# Patient Record
Sex: Male | Born: 1990 | Race: White | Hispanic: No | Marital: Single | Smoking: Former smoker
Health system: Southern US, Community
[De-identification: ages and names within clinical notes are randomized; demographics above are authoritative.]

## PROBLEM LIST (undated history)

## (undated) DIAGNOSIS — J4530 Mild persistent asthma, uncomplicated: Secondary | ICD-10-CM

## (undated) DIAGNOSIS — Z800 Family history of malignant neoplasm of digestive organs: Secondary | ICD-10-CM

## (undated) DIAGNOSIS — Z1509 Genetic susceptibility to other malignant neoplasm: Principal | ICD-10-CM

## (undated) DIAGNOSIS — F909 Attention-deficit hyperactivity disorder, unspecified type: Secondary | ICD-10-CM

## (undated) DIAGNOSIS — K625 Hemorrhage of anus and rectum: Secondary | ICD-10-CM

## (undated) DIAGNOSIS — M549 Dorsalgia, unspecified: Secondary | ICD-10-CM

## (undated) HISTORY — DX: Hemorrhage of anus and rectum: K62.5

## (undated) HISTORY — DX: Dorsalgia, unspecified: M54.9

## (undated) HISTORY — PX: WISDOM TOOTH EXTRACTION: SHX21

## (undated) HISTORY — DX: Family history of malignant neoplasm of digestive organs: Z80.0

## (undated) HISTORY — DX: Mild persistent asthma, uncomplicated: J45.30

## (undated) HISTORY — DX: Genetic susceptibility to other malignant neoplasm: Z15.09

## (undated) HISTORY — DX: Attention-deficit hyperactivity disorder, unspecified type: F90.9

---
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
## 2011-06-29 ENCOUNTER — Ambulatory Visit (HOSPITAL_COMMUNITY): Payer: No Typology Code available for payment source | Admitting: Psychiatry

## 2011-11-30 ENCOUNTER — Ambulatory Visit: Payer: Self-pay | Admitting: Family Medicine

## 2011-12-09 ENCOUNTER — Ambulatory Visit (INDEPENDENT_AMBULATORY_CARE_PROVIDER_SITE_OTHER): Payer: No Typology Code available for payment source | Admitting: Family Medicine

## 2011-12-09 ENCOUNTER — Encounter: Payer: Self-pay | Admitting: Family Medicine

## 2011-12-09 VITALS — BP 101/68 | HR 54 | Ht 66.75 in | Wt 168.0 lb

## 2011-12-09 DIAGNOSIS — F1210 Cannabis abuse, uncomplicated: Secondary | ICD-10-CM

## 2011-12-09 DIAGNOSIS — L723 Sebaceous cyst: Secondary | ICD-10-CM

## 2011-12-09 DIAGNOSIS — L720 Epidermal cyst: Secondary | ICD-10-CM | POA: Insufficient documentation

## 2011-12-09 DIAGNOSIS — F909 Attention-deficit hyperactivity disorder, unspecified type: Secondary | ICD-10-CM

## 2011-12-09 DIAGNOSIS — F1290 Cannabis use, unspecified, uncomplicated: Secondary | ICD-10-CM

## 2011-12-09 MED ORDER — LISDEXAMFETAMINE DIMESYLATE 70 MG PO CAPS: 70.0000 mg | ORAL_CAPSULE | ORAL | Status: DC

## 2011-12-09 NOTE — Assessment & Plan Note (Signed)
Restart vyvanse.  Open 70mg  cap and take 1/2 of contents qd for at least 4-5d and then may increase to whole tab dosing qd like he was on a few months ago.  Therapeutic expectations and side effect profile of medication discussed today.  Patient's questions answered.

## 2011-12-09 NOTE — Progress Notes (Addendum)
Office Note 12/09/2011  CC:  Chief Complaint  Patient presents with  . Establish Care    ADHD meds    HPI:  Jacob Alexander is a 21 y.o. White male who is here to establish care. Patient's most recent primary MD: Dr. Milford Cage at [facility] in [place], [place].  I never saw Rashad there when I worked at [employer]. Old records were not reviewed prior to or during today's visit.  Has long hx of ADHD, most success on vyvanse 70mg  qd although onset of effect still did not fit his needs. Daytrana trial 30mg  qd was unhelpful per pt.  Bupropion in the past "made me feel like a zombie". Due to former MD's transition to a different state, pt has been w/out med or office f/u for 3+ months.  Also asks me to look at a place in his groin, small swollen spot present for about 1 mo.  Denies penile d/c, dysuria, or hx of STD.  Says he has a monogamous relationship w/girlfriend and also says "I've been tested".   Past Medical History  Diagnosis Date  . ADHD (attention deficit hyperactivity disorder)   . Marijuana smoker   . Asthma     History reviewed. No pertinent past surgical history.  Family History  Problem Relation Age of Onset  . Cancer Father     colon  . Heart disease Father     stent.  +CHF  . Cancer Paternal Grandmother     breast  . Hypertension Paternal Grandfather   . Cancer Paternal Grandfather     colon    History   Social History  . Marital Status: Single    Spouse Name: N/A    Number of Children: N/A  . Years of Education: N/A   Occupational History  . Not on file.   Social History Main Topics  . Smoking status: Former Smoker    Types: Cigarettes    Start date: 10/25/2010    Quit date: 11/08/2011  . Smokeless tobacco: Never Used   Comment: smoked Black and Mild, no filters  . Alcohol Use: No  . Drug Use: Yes    Special: Marijuana  . Sexually Active: Not on file   Other Topics Concern  . 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  Smoked 3 ppd cigs x 1 yr, quit 10/2011.No significant exercise.  Normal american diet.   MEDS: none currently  Allergies  Allergen Reactions  . Tetracyclines & Related Swelling    ROS Review of Systems  Constitutional: Negative for fever and fatigue.  HENT: Negative for congestion and sore throat.   Eyes: Negative for visual disturbance.  Respiratory: Negative for cough.   Cardiovascular: Negative for chest pain.  Gastrointestinal: Negative for nausea and abdominal pain.  Genitourinary: Negative for dysuria.  Musculoskeletal: Negative for back pain and joint swelling.  Skin: Negative for rash.  Neurological: Negative for weakness and headaches.  Hematological: Negative for adenopathy.    PE; Blood pressure 101/68, pulse 54, height 5' 6.75" (1.695 m), weight 168 lb (76.204 kg), SpO2 100.00%. Wt Readings from Last 2 Encounters:  12/09/11 168 lb (76.204 kg) (68.23%*)   * Growth percentiles are based on CDC 2-20 Years data.    Gen: alert, oriented x 4, affect pleasant.  Lucid thinking and conversation noted. HEENT: PERRLA, EOMI.   Neck:  no LAD, mass, or thyromegaly. CV: RRR, no m/r/g LUNGS: CTA bilat, nonlabored. NEURO: no tremor or tics noted on observation.  Coordination intact. CN 2-12 grossly intact bilaterally, strength 5/5 in all extremeties.  No ataxia. Groin: in the crease of the right inguinal region there is a 1-2 cm soft/rubbery, subcutaneous nodular lesion that is moveable and nontender.  Remainder of GU exam normal.  Pertinent labs:  None today  ASSESSMENT AND PLAN:   New pt: obtain old records.  ADHD (attention deficit hyperactivity disorder) Restart vyvanse.  Open 70mg  cap and take 1/2 of contents qd for at least 4-5d and then may increase to whole tab dosing qd like he was on a few months ago.   Therapeutic expectations and side effect profile of medication discussed today.  Patient's questions answered.   Epidermal inclusion cyst In right groin crease. Reassured pt. Encouraged application of heat. Call/return if worsening or additional sx's come up.  Marijuana smoker Discussed health risks of this with pt today and strongly recommended he quit smoking marijuana.   He declined the flu vaccine today.  An After Visit Summary was printed and given to the patient.  Return in about 4 months (around 04/10/2012) for f/u ADHD.

## 2011-12-09 NOTE — Assessment & Plan Note (Signed)
Discussed health risks of this with pt today and strongly recommended he quit smoking marijuana.

## 2011-12-09 NOTE — Assessment & Plan Note (Addendum)
In right groin crease. Reassured pt. Encouraged application of heat. Call/return if worsening or additional sx's come up.

## 2012-02-22 ENCOUNTER — Other Ambulatory Visit: Payer: Self-pay | Admitting: *Deleted

## 2012-02-22 NOTE — Telephone Encounter (Signed)
VM left on 12/27 by father requesting RX for Vyvanse to be faxed to [facility].   I received this message today when I returned to the office.  No permission to speak with father.  Message left for mother, Jacob Alexander to return my call regarding this RX.  RX for January printed and given to patient in October.

## 2012-03-03 NOTE — Telephone Encounter (Signed)
RC from [place].  Advised we gave Kemari refills through January when he was here in October.  Advised Dayson was told at that visit that we could refill for 3 months, but he had to make sure he did not lose any RX's as we would not replace any prior to February.  Lurena Joiner will have Elmus look for [employer].

## 2012-03-07 ENCOUNTER — Ambulatory Visit: Payer: No Typology Code available for payment source | Admitting: Family Medicine

## 2012-03-20 ENCOUNTER — Emergency Department (HOSPITAL_COMMUNITY): Payer: No Typology Code available for payment source

## 2012-03-20 ENCOUNTER — Encounter (HOSPITAL_COMMUNITY): Payer: Self-pay | Admitting: *Deleted

## 2012-03-20 ENCOUNTER — Emergency Department (HOSPITAL_COMMUNITY)
Admission: EM | Admit: 2012-03-20 | Discharge: 2012-03-20 | Disposition: A | Discharge status: Home / Self Care | Payer: No Typology Code available for payment source | Attending: Emergency Medicine | Admitting: Emergency Medicine

## 2012-03-20 DIAGNOSIS — Y929 Unspecified place or not applicable: Secondary | ICD-10-CM | POA: Insufficient documentation

## 2012-03-20 DIAGNOSIS — Y939 Activity, unspecified: Secondary | ICD-10-CM | POA: Insufficient documentation

## 2012-03-20 DIAGNOSIS — F17200 Nicotine dependence, unspecified, uncomplicated: Secondary | ICD-10-CM | POA: Insufficient documentation

## 2012-03-20 DIAGNOSIS — J45909 Unspecified asthma, uncomplicated: Secondary | ICD-10-CM | POA: Insufficient documentation

## 2012-03-20 DIAGNOSIS — IMO0002 Reserved for concepts with insufficient information to code with codable children: Secondary | ICD-10-CM | POA: Insufficient documentation

## 2012-03-20 DIAGNOSIS — F1210 Cannabis abuse, uncomplicated: Secondary | ICD-10-CM | POA: Insufficient documentation

## 2012-03-20 DIAGNOSIS — X58XXXA Exposure to other specified factors, initial encounter: Secondary | ICD-10-CM | POA: Insufficient documentation

## 2012-03-20 DIAGNOSIS — T148XXA Other injury of unspecified body region, initial encounter: Secondary | ICD-10-CM

## 2012-03-20 DIAGNOSIS — Z8659 Personal history of other mental and behavioral disorders: Secondary | ICD-10-CM | POA: Insufficient documentation

## 2012-03-20 LAB — CBC WITH DIFFERENTIAL/PLATELET
Basophils Absolute: 0 10*3/uL (ref 0.0–0.1)
Basophils Relative: 0 % (ref 0–1)
Eosinophils Absolute: 0.2 10*3/uL (ref 0.0–0.7)
Eosinophils Relative: 3 % (ref 0–5)
HCT: 43.3 % (ref 39.0–52.0)
Hemoglobin: 15.8 g/dL (ref 13.0–17.0)
Lymphocytes Relative: 32 % (ref 12–46)
Lymphs Abs: 2.6 10*3/uL (ref 0.7–4.0)
MCH: 30.4 pg (ref 26.0–34.0)
MCHC: 36.5 g/dL — ABNORMAL HIGH (ref 30.0–36.0)
MCV: 83.4 fL (ref 78.0–100.0)
Monocytes Absolute: 0.7 10*3/uL (ref 0.1–1.0)
Monocytes Relative: 9 % (ref 3–12)
Neutro Abs: 4.6 10*3/uL (ref 1.7–7.7)
Neutrophils Relative %: 56 % (ref 43–77)
Platelets: 222 10*3/uL (ref 150–400)
RBC: 5.19 MIL/uL (ref 4.22–5.81)
RDW: 12.9 % (ref 11.5–15.5)
WBC: 8.1 10*3/uL (ref 4.0–10.5)

## 2012-03-20 LAB — POCT I-STAT, CHEM 8
BUN: 6 mg/dL (ref 6–23)
Calcium, Ion: 1.22 mmol/L (ref 1.12–1.23)
Chloride: 101 mEq/L (ref 96–112)
Creatinine, Ser: 1 mg/dL (ref 0.50–1.35)
Glucose, Bld: 67 mg/dL — ABNORMAL LOW (ref 70–99)
HCT: 46 % (ref 39.0–52.0)
Hemoglobin: 15.6 g/dL (ref 13.0–17.0)
Potassium: 3.6 mEq/L (ref 3.5–5.1)
Sodium: 142 mEq/L (ref 135–145)
TCO2: 30 mmol/L (ref 0–100)

## 2012-03-20 MED ORDER — IBUPROFEN 400 MG PO TABS
800.0000 mg | ORAL_TABLET | Freq: Once | ORAL | Status: AC
  Administered 2012-03-20: 800 mg via ORAL
  Filled 2012-03-20: qty 2

## 2012-03-20 MED ORDER — DIAZEPAM 5 MG PO TABS
5.0000 mg | ORAL_TABLET | Freq: Once | ORAL | Status: AC
  Administered 2012-03-20: 5 mg via ORAL
  Filled 2012-03-20: qty 1

## 2012-03-20 MED ORDER — DIAZEPAM 5 MG PO TABS: 5.0000 mg | ORAL_TABLET | Freq: Two times a day (BID) | ORAL | Status: DC

## 2012-03-20 NOTE — ED Provider Notes (Signed)
Medical screening examination/treatment/procedure(s) were performed by non-physician practitioner and as supervising physician I was immediately available for consultation/collaboration.   Rickell Wiehe L Vernal Hritz, MD 03/20/12 0652 

## 2012-03-20 NOTE — ED Provider Notes (Signed)
History     CSN: [id number]  Arrival date & time 03/20/12  0112   First MD Initiated Contact with Patient 03/20/12 0129      Chief Complaint  Patient presents with  . Back Pain    (Consider location/radiation/quality/duration/timing/severity/associated sxs/prior treatment) HPI History provided by pt.   Pt developed non-radiating pain in left mid-back yesterday.  Constant but seems to be worst with rest.  Alleviated by applying heat.  Associated w/ occasional SOB when pain is severe.  Denies fever, cough, urinary sx, N/V/D.  Denies trauma.  Had oral surgery yesterday.   Past Medical History  Diagnosis Date  . ADHD (attention deficit hyperactivity disorder)   . Marijuana smoker   . Asthma     Past Surgical History  Procedure Date  . Wisdom tooth extraction     Family History  Problem Relation Age of Onset  . Cancer Father     colon  . Heart disease Father     stent.  +CHF  . Cancer Paternal Grandmother     breast  . Hypertension Paternal Grandfather   . Cancer Paternal Grandfather     colon    History  Substance Use Topics  . Smoking status: Current Some Day Smoker    Types: Cigarettes    Start date: 10/25/2010    Last Attempt to Quit: 11/08/2011  . Smokeless tobacco: Never Used     Comment: smoked Black and Mild, no filters  . Alcohol Use: No      Review of Systems  All other systems reviewed and are negative.    Allergies  Sulfa antibiotics and Tetracyclines & related  Home Medications   Current Outpatient Rx  Name  Route  Sig  Dispense  Refill  . LISDEXAMFETAMINE DIMESYLATE 70 MG PO CAPS   Oral   Take 1 capsule (70 mg total) by mouth every morning.   30 capsule   0     January Rx   . DIAZEPAM 5 MG PO TABS   Oral   Take 1 tablet (5 mg total) by mouth 2 (two) times daily.   12 tablet   0     BP 141/88  Temp 98.3 F (36.8 C) (Oral)  Resp 18  SpO2 96%  Physical Exam  Nursing note and vitals reviewed. Constitutional: He is  oriented to person, place, and time. He appears well-developed and well-nourished. No distress.  HENT:  Head: Normocephalic and atraumatic.  Eyes:       Normal appearance  Neck: Normal range of motion.  Cardiovascular: Normal rate and regular rhythm.   Pulmonary/Chest: Effort normal and breath sounds normal. No respiratory distress.  Abdominal: Soft. Bowel sounds are normal. He exhibits no distension and no mass. There is no tenderness. There is no rebound and no guarding.  Genitourinary:       No CVA tenderness  Musculoskeletal: Normal range of motion.       ENtire spine non-tender.  L paraspinal tenderness at approx T7.  Pt reports that this reproduces pain.    Neurological: He is alert and oriented to person, place, and time.  Skin: Skin is warm and dry. No rash noted.  Psychiatric: He has a normal mood and affect. His behavior is normal.    ED Course  Procedures (including critical care time)  Labs Reviewed  CBC WITH DIFFERENTIAL - Abnormal; Notable for the following:    MCHC 36.5 (*)     All other components within normal limits  POCT I-STAT,  CHEM 8 - Abnormal; Notable for the following:    Glucose, Bld 67 (*)     All other components within normal limits   Dg Chest 2 View  03/20/2012  *RADIOLOGY REPORT*  Clinical Data: Mid to lower back pain; cough and intermittent shortness of breath.  History of smoking and asthma.  CHEST - 2 VIEW  Comparison: Chest radiograph performed 11/22/2008  Findings: The lungs are well-aerated and clear.  There is no evidence of focal opacification, pleural effusion or pneumothorax. Bilateral nipple shadows are seen.  The heart is normal in size; the mediastinal contour is within normal limits.  No acute osseous abnormalities are seen.  IMPRESSION: No acute cardiopulmonary process seen.   Original Report Authenticated By: Tonia Ghent, M.D.      1. Muscle strain       MDM  Healthy Irena Reichmann M presents w/ non-traumatic L mid-back pain. Afebrile, VS  w/in nml range, no respiratory distress, pleuritic pain, pain reproduced w/ palpation of paraspinal muscles, no signs of DVT on exam.  CXR negative for pneumonia.  No RF for or exam findings concerning for PE.  S/sx most consistent w/ muscle strain.  Pt received po valium and ibuprofen.  At time of discharge he is pain free.  Recommended PCP f/u if pain has not started to improve by Monday, and return for worsening pain, fever or dyspnea.        Otilio Miu, PA-C 03/20/12 [phone]

## 2012-03-20 NOTE — ED Notes (Signed)
C/o back pain, onset Friday night. Mentions wisdom teeth extraction on Thursday. Also sore throat. Denies other sx. Taking vicodin and toradol. Not sure if back pain r/t meds. Has tried heat and massage. Heat relieves pain "a little". Pinpoints pain to bilateral flank.

## 2012-03-21 ENCOUNTER — Other Ambulatory Visit (INDEPENDENT_AMBULATORY_CARE_PROVIDER_SITE_OTHER): Payer: No Typology Code available for payment source

## 2012-03-21 DIAGNOSIS — M549 Dorsalgia, unspecified: Secondary | ICD-10-CM

## 2012-03-21 LAB — POCT URINALYSIS DIPSTICK
Ketones, UA: NEGATIVE
Leukocytes, UA: NEGATIVE
Protein, UA: NEGATIVE
Urobilinogen, UA: 0.2

## 2012-03-22 ENCOUNTER — Ambulatory Visit (INDEPENDENT_AMBULATORY_CARE_PROVIDER_SITE_OTHER): Payer: No Typology Code available for payment source | Admitting: Family Medicine

## 2012-03-22 ENCOUNTER — Ambulatory Visit (HOSPITAL_BASED_OUTPATIENT_CLINIC_OR_DEPARTMENT_OTHER)
Admission: RE | Admit: 2012-03-22 | Discharge: 2012-03-22 | Disposition: A | Discharge status: Home / Self Care | Payer: No Typology Code available for payment source | Source: Ambulatory Visit | Attending: Family Medicine | Admitting: Family Medicine

## 2012-03-22 ENCOUNTER — Encounter: Payer: Self-pay | Admitting: Family Medicine

## 2012-03-22 ENCOUNTER — Encounter (HOSPITAL_BASED_OUTPATIENT_CLINIC_OR_DEPARTMENT_OTHER): Payer: Self-pay

## 2012-03-22 VITALS — BP 110/72 | HR 76 | Temp 98.2°F | Resp 16 | Wt 169.0 lb

## 2012-03-22 DIAGNOSIS — R079 Chest pain, unspecified: Secondary | ICD-10-CM

## 2012-03-22 DIAGNOSIS — M549 Dorsalgia, unspecified: Secondary | ICD-10-CM

## 2012-03-22 DIAGNOSIS — M546 Pain in thoracic spine: Secondary | ICD-10-CM

## 2012-03-22 DIAGNOSIS — F909 Attention-deficit hyperactivity disorder, unspecified type: Secondary | ICD-10-CM

## 2012-03-22 HISTORY — DX: Dorsalgia, unspecified: M54.9

## 2012-03-22 MED ORDER — CYCLOBENZAPRINE HCL 10 MG PO TABS: 10.0000 mg | ORAL_TABLET | Freq: Three times a day (TID) | ORAL | Status: DC | PRN

## 2012-03-22 MED ORDER — IOHEXOL 300 MG/ML  SOLN: 100.0000 mL | Freq: Once | INTRAMUSCULAR | Status: DC | PRN

## 2012-03-22 MED ORDER — IOHEXOL 350 MG/ML SOLN
100.0000 mL | Freq: Once | INTRAVENOUS | Status: AC | PRN
  Administered 2012-03-22: 100 mL via INTRAVENOUS

## 2012-03-22 MED ORDER — LISDEXAMFETAMINE DIMESYLATE 70 MG PO CAPS: 70.0000 mg | ORAL_CAPSULE | ORAL | Status: DC

## 2012-03-22 MED ORDER — HYDROCODONE-ACETAMINOPHEN 7.5-300 MG PO TABS: ORAL_TABLET | ORAL | Status: DC

## 2012-03-22 NOTE — Assessment & Plan Note (Addendum)
Unclear etiology, worsening, preliminary w/u unrevealing. Will obtain CT chest to r/o PE. New rx for vicodin 7.5/300, 1-2 q6h prn, #30, no RF. Added flexeril 10mg  q8h prn, #30, no RF. Therapeutic expectations and side effect profile of medication discussed today.  Patient's questions answered.

## 2012-03-22 NOTE — Patient Instructions (Addendum)
Go to [facility] for a chest CT scan now.--thx

## 2012-03-22 NOTE — Progress Notes (Signed)
OFFICE NOTE  03/22/2012  CC:  Chief Complaint  Patient presents with  . Back Pain    mid to lower back..had dental surgery last thursaday.  . Medication Refill    vyvanse     HPI: Patient is a 22 y.o. Caucasian male who is here for left mid back pain.  Mom is with him today and they are becoming increasingly distressed about his recent back pain. Onset was 03/16/12, after having wisdom teeth removed.    Constant, severe ache in left mid back without radiation, gettting worse over time.  Sitting up straight makes it worse.  Eating relieves it a little bit, urinating no change, BM no change.  Last BM 2 d/a. No fever.  No hx of injury/trauma/strain to the symptomatic region. Vicodin 7.5/325--1-2 tabs alleviates the pain enough to walk around but pt repeatedly asked for stronger pain med today, specifically citing past success when rx'd oxycodone 15mg  tabs for some other acute pain.   Presented to ED on 03/20/12, eval resulted in dx of muscle strain (entire ED record, including imaging and blood work reviewed in office today--all normal). Took ketorolac that ED gave him, no help.  Took valium 3-4 times but it didn't help.   He came by my office yesterday around 4:30 and gave a urine specimen due to concern that maybe he was having a kidney stone.  This urinalysis was normal.  Of note, he is stable/doing well from an ADHD standpoint on his current vyvanse 70mg  qd, states he is due for RF, he and mom request 90 d supply for mail order.  ROS: +GER.  No nausea, no cough, no SOB, no CP, no fevers.  No tooth, jaw, or mouth pain.  Pertinent PMH:   Past Medical History  Diagnosis Date  . ADHD (attention deficit hyperactivity disorder)   . Marijuana smoker   . Asthma    Past Surgical History  Procedure Date  . Wisdom tooth extraction      MEDS:  Outpatient Prescriptions Prior to Visit  Medication Sig Dispense Refill  . diazepam (VALIUM) 5 MG tablet Take 1 tablet (5 mg total) by mouth 2  (two) times daily.  12 tablet  0  . lisdexamfetamine (VYVANSE) 70 MG capsule Take 1 capsule (70 mg total) by mouth every morning.  30 capsule  0   Last reviewed on 03/22/2012  8:34 AM by Jeoffrey Massed, MD  PE: Blood pressure 110/72, pulse 76, temperature 98.2 F (36.8 C), temperature source Temporal, resp. rate 16, weight 169 lb (76.658 kg), SpO2 99.00%. Gen: appears tired, worried, but is not in any acute distress.  Alert, oriented x 4. ENT:   Eyes: no injection, icteris, swelling, or exudate.  EOMI, PERRLA. Nose: no drainage or turbinate edema/swelling.  No injection or focal lesion.  Mouth: lips without lesion/swelling.  Oral mucosa pink and moist.  Dentition intact and without obvious caries or gingival swelling.  Oropharynx without erythema, exudate, or swelling.  Neck - No masses or thyromegaly or limitation in range of motion CV: RRR, no m/r/g.   LUNGS: CTA bilat, nonlabored resps, good aeration in all lung fields. ABD: soft, benign. EXT: no clubbing, cyanosis, or edema.  BACK: no CVA tenderness.  No spinous process or facet joint tenderness. He has no significant tenderness to palpation in the area of his pain complaint---around mid thoracic level near the medial border of the scapula.  No rash.  No hypesthesia of skin.  No flank tenderness. He can raise arms  above his head and reach under and touch his scapulae without an increase in his pain.  Lab Results  Component Value Date   WBC 8.1 03/20/2012   HGB 15.6 03/20/2012   HCT 46.0 03/20/2012   MCV 83.4 03/20/2012   PLT 222 03/20/2012     Chemistry      Component Value Date/Time   NA 142 03/20/2012 0147   K 3.6 03/20/2012 0147   CL 101 03/20/2012 0147   BUN 6 03/20/2012 0147   CREATININE 1.00 03/20/2012 0147   No results found for this basename: CALCIUM, ALKPHOS, AST, ALT, BILITOT     CXR 03/20/12: no abnormality Urinalysis yesterday 03/21/12 was normal.   IMPRESSION AND PLAN:  Mid back pain on left side Unclear etiology,  worsening, preliminary w/u unrevealing. Will obtain CT chest to r/o PE. New rx for vicodin 7.5/300, 1-2 q6h prn, #30, no RF. Added flexeril 10mg  q8h prn, #30, no RF. Therapeutic expectations and side effect profile of medication discussed today.  Patient's questions answered.   ADHD (attention deficit hyperactivity disorder) Vyvanse 70mg , 1 cap po qAM, #90, no RF (90 day supply)--rx handed to pt's mom today for her to send in as mail order. Therapeutic expectations and side effect profile of medication discussed today.  Patient's questions answered.    An After Visit Summary was printed and given to the patient.  FOLLOW UP: to be determined based on CT scan results and

## 2012-03-22 NOTE — Assessment & Plan Note (Signed)
Vyvanse 70mg , 1 cap po qAM, #90, no RF (90 day supply)--rx handed to pt's mom today for her to send in as mail order. Therapeutic expectations and side effect profile of medication discussed today.  Patient's questions answered.

## 2012-04-08 ENCOUNTER — Ambulatory Visit: Payer: No Typology Code available for payment source | Admitting: Family Medicine

## 2012-05-27 ENCOUNTER — Emergency Department (HOSPITAL_COMMUNITY)
Admission: EM | Admit: 2012-05-27 | Discharge: 2012-05-27 | Disposition: A | Discharge status: Home / Self Care | Payer: No Typology Code available for payment source | Attending: Emergency Medicine | Admitting: Emergency Medicine

## 2012-05-27 ENCOUNTER — Encounter (HOSPITAL_COMMUNITY): Payer: Self-pay | Admitting: Emergency Medicine

## 2012-05-27 ENCOUNTER — Emergency Department (HOSPITAL_COMMUNITY): Payer: No Typology Code available for payment source

## 2012-05-27 DIAGNOSIS — J3489 Other specified disorders of nose and nasal sinuses: Secondary | ICD-10-CM | POA: Insufficient documentation

## 2012-05-27 DIAGNOSIS — R0789 Other chest pain: Secondary | ICD-10-CM | POA: Insufficient documentation

## 2012-05-27 DIAGNOSIS — F909 Attention-deficit hyperactivity disorder, unspecified type: Secondary | ICD-10-CM | POA: Insufficient documentation

## 2012-05-27 DIAGNOSIS — R209 Unspecified disturbances of skin sensation: Secondary | ICD-10-CM | POA: Insufficient documentation

## 2012-05-27 DIAGNOSIS — Z79899 Other long term (current) drug therapy: Secondary | ICD-10-CM | POA: Insufficient documentation

## 2012-05-27 DIAGNOSIS — J45901 Unspecified asthma with (acute) exacerbation: Secondary | ICD-10-CM | POA: Insufficient documentation

## 2012-05-27 DIAGNOSIS — F17200 Nicotine dependence, unspecified, uncomplicated: Secondary | ICD-10-CM | POA: Insufficient documentation

## 2012-05-27 DIAGNOSIS — M940 Chondrocostal junction syndrome [Tietze]: Secondary | ICD-10-CM | POA: Insufficient documentation

## 2012-05-27 MED ORDER — PREDNISONE 20 MG PO TABS
60.0000 mg | ORAL_TABLET | Freq: Once | ORAL | Status: AC
  Administered 2012-05-27: 60 mg via ORAL
  Filled 2012-05-27: qty 3

## 2012-05-27 MED ORDER — PREDNISONE 10 MG PO TABS: 50.0000 mg | ORAL_TABLET | Freq: Every day | ORAL | Status: DC

## 2012-05-27 NOTE — ED Provider Notes (Signed)
History     CSN: [id number]  Arrival date & time 05/27/12  2141   First MD Initiated Contact with Patient 05/27/12 2151      Chief Complaint  Patient presents with  . Shortness of Breath    (Consider location/radiation/quality/duration/timing/severity/associated sxs/prior treatment) HPI  Patient is 22 yo M with PMHx asthma presenting with left sided pleuritic chest pain w/o radiation and shortness of breath x 2 days. Patient woke up with chest pain, denies trauma or recent illness. Pain worsened with deep inspiration, having associated trouble catching breath d/t pain. Associated numbness and tingling in left forearm and hand w/o weakness. Denies fevers, chills, nausea, vomiting, headache, diarrhea, neck pain, urinary symptoms.   Past Medical History  Diagnosis Date  . ADHD (attention deficit hyperactivity disorder)   . Marijuana smoker   . Asthma     Past Surgical History  Procedure Laterality Date  . Wisdom tooth extraction      Family History  Problem Relation Age of Onset  . Cancer Father     colon  . Heart disease Father     stent.  +CHF  . Cancer Paternal Grandmother     breast  . Hypertension Paternal Grandfather   . Cancer Paternal Grandfather     colon    History  Substance Use Topics  . Smoking status: Current Some Day Smoker    Types: Cigarettes    Start date: 10/25/2010    Last Attempt to Quit: 11/08/2011  . Smokeless tobacco: Never Used     Comment: smoked Black and Mild, no filters  . Alcohol Use: No      Review of Systems  Constitutional: Positive for fatigue. Negative for fever, chills and diaphoresis.  HENT: Negative for neck pain and neck stiffness.   Eyes: Negative for visual disturbance.  Respiratory: Positive for chest tightness and shortness of breath. Negative for cough and wheezing.   Cardiovascular: Positive for chest pain. Negative for palpitations and leg swelling.  Gastrointestinal: Negative for abdominal pain.  Genitourinary:  Negative for dysuria.  Musculoskeletal: Negative for back pain.  Skin: Negative.   Neurological: Negative for headaches.    Allergies  Sulfa antibiotics and Tetracyclines & related  Home Medications   Current Outpatient Rx  Name  Route  Sig  Dispense  Refill  . cyclobenzaprine (FLEXERIL) 10 MG tablet   Oral   Take 1 tablet (10 mg total) by mouth 3 (three) times daily as needed for muscle spasms.   30 tablet   0   . diazepam (VALIUM) 5 MG tablet   Oral   Take 1 tablet (5 mg total) by mouth 2 (two) times daily.   12 tablet   0   . Hydrocodone-Acetaminophen 7.5-300 MG TABS      1-2 tabs po q6h prn pain   30 each   0   . lisdexamfetamine (VYVANSE) 70 MG capsule   Oral   Take 1 capsule (70 mg total) by mouth every morning.   90 capsule   0     January Rx     BP 125/78  Pulse 74  Temp(Src) 98 F (36.7 C)  Resp 16  SpO2 100%  Physical Exam  Constitutional: He appears well-developed and well-nourished. No distress. He is not intubated.  HENT:  Head: Normocephalic and atraumatic.  Eyes: Conjunctivae are normal.  Neck: Normal range of motion. Neck supple. No spinous process tenderness and no muscular tenderness present. Normal range of motion present.  Cardiovascular: Normal rate, regular  rhythm, normal heart sounds and intact distal pulses.  Exam reveals no gallop and no friction rub.   No murmur heard. Pulses:      Radial pulses are 2+ on the right side, and 2+ on the left side.  Pulmonary/Chest: Breath sounds normal. No accessory muscle usage. No apnea, not tachypneic and not bradypneic. He is not intubated. No respiratory distress. He exhibits tenderness. He exhibits no laceration, no crepitus, no edema, no deformity, no swelling and no retraction.    Unlabored breathing, no accessory muscle use, no nasal flaring. Talking without difficulty. No cyanosis noted. Lung fields CTA, no wheezes, rales, rhonchi noted. Symmetrical chest expansion. No obvious deformity or  trauma to chest wall. No rashes or bruising noted on chest wall.   Musculoskeletal:       Right shoulder: Normal.       Left shoulder: Normal.       Left elbow: Normal.       Left wrist: Normal.  R and L upper extremity 5/5 strength, no sensory deficit, radial pulse 2+ bilat, cap refill <2sec. No neurovascular deficits.   Skin: Skin is warm, dry and intact. He is not diaphoretic. No cyanosis. No pallor.    ED Course  Procedures (including critical care time)  Medications  predniSONE (DELTASONE) tablet 60 mg (60 mg Oral Given 05/27/12 2258)   Patient able to ambulate without respiratory difficulties.    Date: 05/27/2012  Rate: 85  Rhythm: normal sinus rhythm  QRS Axis: normal  Intervals: QT prolonged  ST/T Wave abnormalities: normal  Conduction Disutrbances:incomplete right bundle branch block  Narrative Interpretation:   Old EKG Reviewed: none available   Labs Reviewed - No data to display Dg Chest 2 View  05/27/2012  *RADIOLOGY REPORT*  Clinical Data: Shortness of breath.  Left lower anterior rib pain. No known injury.  History of smoking, asthma.  CHEST - 2 VIEW  Comparison: 03/20/2012  Findings: Heart size is normal.  There is mild bronchitic thickening.  No focal consolidations or pleural effusions identified.  No pulmonary edema.  No evidence for pneumothorax. Visualized osseous structures have a normal appearance.  IMPRESSION:  1.  Mild bronchitic thickening. 2. No focal pulmonary abnormality.   Original Report Authenticated By: Norva Pavlov, M.D.      1. Costochondritis, acute       MDM  Patient is to be discharged with recommendation to follow up with PCP in regards to today's hospital visit. Chest pain is not likely of cardiac or pulmonary etiology d/t presentation, perc negative, VSS, no tracheal deviation, no JVD or new murmur, RRR, breath sounds equal bilaterally, no respiratory distress at rest or with ambulation, no accessory muscle use. EKG without acute  abnormalities and negative CXR. Pt has been started on Prednisone 5 day burst for pleuritic chest pain and return to the ED is CP becomes exertional, associated with diaphoresis or nausea, radiates to left jaw/arm, worsens or becomes concerning in any way. Pt appears reliable for follow up and is agreeable to discharge. Case has been discussed with and seen by Dr. Hyacinth Meeker who agrees with the above plan to discharge. Patient is stable at time of discharge.          Lise Auer Myrakle Wingler, PA-C 05/28/12 0830

## 2012-05-27 NOTE — ED Notes (Signed)
Pt alert, arrives from home, c/o sob, onset was several days ago, resp even unlabored, skin pwd, no s/s of distress or discomfort noted

## 2012-05-28 NOTE — ED Provider Notes (Signed)
Medical screening examination/treatment/procedure(s) were performed by non-physician practitioner and as supervising physician I was immediately available for consultation/collaboration.    Karly Pitter D Davion Flannery, MD 05/28/12 1455 

## 2012-06-08 ENCOUNTER — Ambulatory Visit (INDEPENDENT_AMBULATORY_CARE_PROVIDER_SITE_OTHER): Payer: No Typology Code available for payment source | Admitting: Family Medicine

## 2012-06-08 ENCOUNTER — Encounter: Payer: Self-pay | Admitting: Family Medicine

## 2012-06-08 ENCOUNTER — Telehealth: Payer: Self-pay | Admitting: Family Medicine

## 2012-06-08 VITALS — BP 110/70 | HR 78 | Temp 98.1°F | Ht 66.75 in | Wt 171.2 lb

## 2012-06-08 DIAGNOSIS — K625 Hemorrhage of anus and rectum: Secondary | ICD-10-CM

## 2012-06-08 DIAGNOSIS — Z0000 Encounter for general adult medical examination without abnormal findings: Secondary | ICD-10-CM

## 2012-06-08 HISTORY — DX: Hemorrhage of anus and rectum: K62.5

## 2012-06-08 LAB — CBC WITH DIFFERENTIAL/PLATELET
Eosinophils Relative: 3 % (ref 0.0–5.0)
HCT: 43.3 % (ref 39.0–52.0)
Lymphocytes Relative: 33.8 % (ref 12.0–46.0)
Monocytes Relative: 10.8 % (ref 3.0–12.0)
Neutrophils Relative %: 51.9 % (ref 43.0–77.0)
Platelets: 217 10*3/uL (ref 150.0–400.0)
WBC: 4.7 10*3/uL (ref 4.5–10.5)

## 2012-06-08 LAB — COMPREHENSIVE METABOLIC PANEL
AST: 18 U/L (ref 0–37)
Alkaline Phosphatase: 49 U/L (ref 39–117)
BUN: 12 mg/dL (ref 6–23)
Creatinine, Ser: 0.9 mg/dL (ref 0.4–1.5)

## 2012-06-08 LAB — LIPID PANEL
Cholesterol: 171 mg/dL (ref 0–200)
HDL: 47.6 mg/dL (ref >39.00)
LDL Cholesterol: 108 mg/dL — ABNORMAL HIGH (ref 0–99)
Triglycerides: 79 mg/dL (ref 0.0–149.0)
VLDL: 15.8 mg/dL (ref 0.0–40.0)

## 2012-06-08 MED ORDER — HPV QUADRIVALENT VACCINE IM SUSP
0.5000 mL | Freq: Once | INTRAMUSCULAR | Status: AC
  Administered 2012-06-08: 0.5 mL via INTRAMUSCULAR

## 2012-06-08 MED ORDER — HEPATITIS A VACCINE 1440 EL U/ML IM SUSP
1.0000 mL | Freq: Once | INTRAMUSCULAR | Status: AC
  Administered 2012-06-08: 1440 [IU] via INTRAMUSCULAR

## 2012-06-08 NOTE — Telephone Encounter (Signed)
Please mail a copy of his 06/08/12 bloodwork results to his house.

## 2012-06-08 NOTE — Telephone Encounter (Signed)
Aware. 

## 2012-06-08 NOTE — Progress Notes (Signed)
Office Note 06/08/2012  CC:  Chief Complaint  Patient presents with  . Annual Exam    HPI:  Jacob Alexander is a 22 y.o. 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 Has had some BRBPR and painful/tearing feeling in anal region--toilet paper with small amount of blood when wiping after most bm's for last 2 wks or so.   The stool itself is unremarkable.  He does say he sits sometimes for an hour at a time on the toilet playing his video game.  Recent ED visit noted: pleuritic chest pain on left anterior region, cxr showed some mild bronchitic changes, prednisone rx'd and the pain resolved in a few days.  He says he quit smoking marijuana comletely last week.  His dad is with him today and says they want to start screening him for lynch syndrome when he is around age 68 (his dad and paternal uncle have this).  He reports being compliant with vyvanse on "most days".  Past Medical History  Diagnosis Date  . ADHD (attention deficit hyperactivity disorder)   . Marijuana smoker   . Asthma     Past Surgical History  Procedure Laterality Date  . Wisdom tooth extraction      Family History  Problem Relation Age of Onset  . Cancer Father     colon  . Heart disease Father     stent.  +CHF  . Cancer Paternal Grandmother     breast  . Hypertension Paternal Grandfather   . Cancer Paternal Grandfather     colon    History   Social History  . Marital Status: Single    Spouse Name: N/A    Number of Children: N/A  . Years of Education: N/A   Occupational History  . Not on file.   Social History Main Topics  . Smoking status: Former Smoker    Types: Cigarettes    Start date: 10/25/2010    Quit date: 11/08/2011  . Smokeless tobacco: Never Used     Comment: smoked Black and Mild, no filters  . Alcohol Use: No  . Drug Use: Yes    Special: Marijuana  . Sexually Active: Not on file   Other Topics Concern  . Not on file   Social History Narrative   Lives in [place] with parents.   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   Girlfriend x 3 yrs, monogamous, no hx of STD's.   Hx of regular marijuana use. Smoked 3 ppd cigs x 1 yr, quit 10/2011.   No significant exercise.  Normal american diet.                Outpatient Prescriptions Prior to Visit  Medication Sig Dispense Refill  . calcium carbonate (TUMS - DOSED IN MG ELEMENTAL CALCIUM) 500 MG chewable tablet Chew 1 tablet by mouth daily.      Marland Kitchen lisdexamfetamine (VYVANSE) 70 MG capsule Take 1 capsule (70 mg total) by mouth every morning.  90 capsule  0  . predniSONE (DELTASONE) 10 MG tablet Take 5 tablets (50 mg total) by mouth daily.  25 tablet  0   No facility-administered medications prior to visit.    Allergies  Allergen Reactions  . Sulfa Antibiotics Anaphylaxis  . Tetracyclines & Related Other (See Comments)    Teeth staining    ROS Review of Systems  Constitutional: Negative for fever, chills, appetite change and fatigue.  HENT: Negative for ear pain, congestion, sore throat, neck stiffness and  dental problem.   Eyes: Negative for discharge, redness and visual disturbance.  Respiratory: Negative for cough, chest tightness, shortness of breath and wheezing.   Cardiovascular: Negative for chest pain, palpitations and leg swelling.  Gastrointestinal: Positive for anal bleeding (see HPI). Negative for nausea, vomiting, abdominal pain, diarrhea and blood in stool.  Genitourinary: Negative for dysuria, urgency, frequency, hematuria, flank pain and difficulty urinating.  Musculoskeletal: Negative for myalgias, back pain, joint swelling and arthralgias.  Skin: Negative for pallor and rash.  Neurological: Negative for dizziness, speech difficulty, weakness and headaches.  Hematological: Negative for adenopathy. Does not bruise/bleed easily.  Psychiatric/Behavioral: Negative for confusion and sleep disturbance. The patient is not nervous/anxious.      PE; Blood pressure  110/70, pulse 78, temperature 98.1 F (36.7 C), temperature source Oral, height 5' 6.75" (1.695 m), weight 171 lb 4 oz (77.678 kg), SpO2 98.00%. Gen: Alert, well appearing.  Patient is oriented to person, place, time, and situation. AFFECT: pleasant, lucid thought and speech. ENT: Ears: EACs clear, normal epithelium.  TMs with good light reflex and landmarks bilaterally.  Eyes: no injection, icteris, swelling, or exudate.  EOMI, PERRLA. Nose: no drainage or turbinate edema/swelling.  No injection or focal lesion.  Mouth: lips without lesion/swelling.  Oral mucosa pink and moist.  Dentition intact and without obvious caries or gingival swelling.  Oropharynx without erythema, exudate, or swelling.  Neck: supple/nontender.  No LAD, mass, or TM.  Carotid pulses 2+ bilaterally, without bruits. CV: RRR, no m/r/g.   LUNGS: CTA bilat, nonlabored resps, good aeration in all lung fields. ABD: soft, NT, ND, BS normal.  No hepatospenomegaly or mass.  No bruits. EXT: no clubbing, cyanosis, or edema.  Musculoskeletal: no joint swelling, erythema, warmth, or tenderness.  ROM of all joints intact. Skin - no sores or suspicious lesions or rashes or color changes Genitals normal; both testes normal without tenderness, masses, hydroceles, varicoceles, erythema or swelling. Shaft normal, circumcised, meatus normal without discharge. No inguinal hernia noted. No inguinal lymphadenopathy. Rectal exam: negative without mass, lesions or tenderness.  I cannot see any external hemorrhoids or anal fissure Prostate gland is smooth and symmetric without nodules or tenderness.  Pertinent labs:  Lab Results  Component Value Date   WBC 4.7 06/08/2012   HGB 14.8 06/08/2012   HCT 43.3 06/08/2012   MCV 85.7 06/08/2012   PLT 217.0 06/08/2012     Chemistry      Component Value Date/Time   NA 139 06/08/2012 1130   K 4.5 06/08/2012 1130   CL 103 06/08/2012 1130   CO2 28 06/08/2012 1130   BUN 12 06/08/2012 1130   CREATININE 0.9  06/08/2012 1130      Component Value Date/Time   CALCIUM 9.6 06/08/2012 1130   ALKPHOS 49 06/08/2012 1130   AST 18 06/08/2012 1130   ALT 17 06/08/2012 1130   BILITOT 0.8 06/08/2012 1130       ASSESSMENT AND PLAN:   Health maintenance examination Reviewed age and gender appropriate health maintenance issues (prudent diet, regular exercise, health risks of tobacco and excessive alcohol, use of seatbelts, fire alarms in home, use of sunscreen).  Also reviewed age and gender appropriate health screening as well as vaccine recommendations. Encouraged him to remain OFF of marijuana. Hep A #2 and Gardisil #1 given today.  BRBPR (bright red blood per rectum) Reassured pt that this sounds like anal fissure bleeding/pain, even though I could not identify a fissure on exam today. Encouraged pt to get off the  toilet the instant he is finished having a bm--no prolonged sitting and playing video game on toilet. Encouraged daily metamucil and add senakot S 1-2 tabs qhs to keep stool soft/regular until the fissue is healed and he doesn't feel pain or see blood for about a week.   CBC, CMET, and FLP drawn today.  An After Visit Summary was printed and given to the patient.  FOLLOW UP:  Return in about 1 month (around 07/08/2012) for f/u BRBPR and get gardisil #2.Marland Kitchen

## 2012-06-08 NOTE — Patient Instructions (Addendum)
Take metamucil once every day. Take senakot S (OTC, generic) and take 1-2 tabs every night.

## 2012-06-08 NOTE — Assessment & Plan Note (Addendum)
Reviewed age and gender appropriate health maintenance issues (prudent diet, regular exercise, health risks of tobacco and excessive alcohol, use of seatbelts, fire alarms in home, use of sunscreen).  Also reviewed age and gender appropriate health screening as well as vaccine recommendations. Encouraged him to remain OFF of marijuana. Hep A #2 and Gardisil #1 given today.

## 2012-06-08 NOTE — Assessment & Plan Note (Signed)
Reassured pt that this sounds like anal fissure bleeding/pain, even though I could not identify a fissure on exam today. Encouraged pt to get off the toilet the instant he is finished having a bm--no prolonged sitting and playing video game on toilet. Encouraged daily metamucil and add senakot S 1-2 tabs qhs to keep stool soft/regular until the fissue is healed and he doesn't feel pain or see blood for about a week.

## 2012-06-24 ENCOUNTER — Encounter: Payer: Self-pay | Admitting: Family Medicine

## 2012-06-24 ENCOUNTER — Ambulatory Visit (INDEPENDENT_AMBULATORY_CARE_PROVIDER_SITE_OTHER): Payer: No Typology Code available for payment source | Admitting: Family Medicine

## 2012-06-24 VITALS — BP 109/70 | HR 69 | Temp 97.7°F | Resp 16 | Wt 172.0 lb

## 2012-06-24 DIAGNOSIS — J4531 Mild persistent asthma with (acute) exacerbation: Secondary | ICD-10-CM

## 2012-06-24 DIAGNOSIS — J45901 Unspecified asthma with (acute) exacerbation: Secondary | ICD-10-CM

## 2012-06-24 MED ORDER — BUDESONIDE 180 MCG/ACT IN AEPB: 1.0000 | INHALATION_SPRAY | Freq: Two times a day (BID) | RESPIRATORY_TRACT | Status: DC

## 2012-06-24 MED ORDER — ALBUTEROL SULFATE HFA 108 (90 BASE) MCG/ACT IN AERS: 2.0000 | INHALATION_SPRAY | Freq: Four times a day (QID) | RESPIRATORY_TRACT | Status: DC | PRN

## 2012-06-24 MED ORDER — PREDNISONE 20 MG PO TABS: ORAL_TABLET | ORAL | Status: DC

## 2012-06-24 NOTE — Progress Notes (Signed)
OFFICE NOTE  06/24/2012  CC:  Chief Complaint  Patient presents with  . Bronchitis    Pt c/o of tightness in chest, trouble with deep breath & respiratory pain, wheezing      HPI: Patient is a 22 y.o. Caucasian male who is here for respiratory complaints.   Onset about 4 wks ago, initially cleared up with prednisone that he was rx'd at an ED visit.  Then wheezing, chest tightness again.  No cough.  Chest feels worse with deep breaths. He has not been smoking marijuana or cigarettes any lately.  Says he has been rx'd with asthma around middle school age, was rx'd an albut inhaler "but I never needed it".  Also was put on inhaled steroid in the past but he doesn't recall if he ever took it with regularity.  Pertinent PMH:  Past Medical History  Diagnosis Date  . ADHD (attention deficit hyperactivity disorder)   . Marijuana smoker   . Asthma     MEDS:  Outpatient Prescriptions Prior to Visit  Medication Sig Dispense Refill  . calcium carbonate (TUMS - DOSED IN MG ELEMENTAL CALCIUM) 500 MG chewable tablet Chew 1 tablet by mouth daily.      Marland Kitchen lisdexamfetamine (VYVANSE) 70 MG capsule Take 1 capsule (70 mg total) by mouth every morning.  90 capsule  0   No facility-administered medications prior to visit.    PE: Blood pressure 109/70, pulse 69, temperature 97.7 F (36.5 C), temperature source Oral, resp. rate 16, weight 172 lb (78.019 kg), SpO2 96.00%. Gen: Alert, well appearing.  Patient is oriented to person, place, time, and situation. ENT: Ears: EACs clear, normal epithelium.  TMs with good light reflex and landmarks bilaterally.  Eyes: no injection, icteris, swelling, or exudate.  EOMI, PERRLA. Nose: no drainage or turbinate edema/swelling.  No injection or focal lesion.  Mouth: lips without lesion/swelling.  Oral mucosa pink and moist.  Dentition intact and without obvious caries or gingival swelling.  Oropharynx without erythema, exudate, or swelling.  Neck - No masses or  thyromegaly or limitation in range of motion CV: RRR, no m/r/g LUNGS: diffuse insp rhonchi, mildly decreased aeration on expiration but no wheezing.  Exp phase mildly prolonged. Nonlabored resps. EXT: no clubbing, cyanosis, or edema.    IMPRESSION AND PLAN:  1) Acute asthma flare vs mild persistent asthma under poor control. Prednisone 40mg  qd x 5d.  Albuterol HFA q4h prn.   Restart pulmicort flexhaler, 1 puff bid.   FOLLOW UP: 6 wks

## 2012-06-28 DIAGNOSIS — J4531 Mild persistent asthma with (acute) exacerbation: Secondary | ICD-10-CM | POA: Insufficient documentation

## 2012-07-13 ENCOUNTER — Ambulatory Visit: Payer: No Typology Code available for payment source | Admitting: Family Medicine

## 2012-08-16 ENCOUNTER — Other Ambulatory Visit: Payer: Self-pay | Admitting: Family Medicine

## 2012-08-16 NOTE — Telephone Encounter (Signed)
Spoke with patients mom, Lurena Joiner and patient will need to be seen to get anymore refills.  Patient has multiple issues that were never followed up on so she is going to make him an appointment to see Dr. Milinda Cave.

## 2012-08-24 ENCOUNTER — Ambulatory Visit (INDEPENDENT_AMBULATORY_CARE_PROVIDER_SITE_OTHER): Payer: No Typology Code available for payment source | Admitting: Family Medicine

## 2012-08-24 ENCOUNTER — Encounter: Payer: Self-pay | Admitting: Family Medicine

## 2012-08-24 VITALS — BP 113/76 | HR 65 | Temp 98.0°F | Resp 16 | Ht 66.5 in | Wt 175.0 lb

## 2012-08-24 DIAGNOSIS — J45909 Unspecified asthma, uncomplicated: Secondary | ICD-10-CM

## 2012-08-24 DIAGNOSIS — F909 Attention-deficit hyperactivity disorder, unspecified type: Secondary | ICD-10-CM

## 2012-08-24 DIAGNOSIS — J4530 Mild persistent asthma, uncomplicated: Secondary | ICD-10-CM

## 2012-08-24 MED ORDER — LISDEXAMFETAMINE DIMESYLATE 70 MG PO CAPS: 70.0000 mg | ORAL_CAPSULE | ORAL | Status: DC

## 2012-08-24 NOTE — Progress Notes (Signed)
OFFICE NOTE  08/24/2012  CC:  Chief Complaint  Patient presents with  . ADHD  . Medication Refill     HPI: Patient is a 22 y.o. Caucasian male who is here for routine ADHD f/u, on vyvanse.  Last time I saw him for this and gave 90 day rx was 02/2012. Takes vyvanse weekdays but not weekends.  No side effects. Says his breathing has been MUCH improved since taking pulmicort bid, not needing albuterol for rescue.  Pertinent PMH:  Past Medical History  Diagnosis Date  . ADHD (attention deficit hyperactivity disorder)   . Marijuana smoker   . Asthma     MEDS: NOTE: not taking prednisone (finsihed course) Outpatient Prescriptions Prior to Visit  Medication Sig Dispense Refill  . albuterol (VENTOLIN HFA) 108 (90 BASE) MCG/ACT inhaler Inhale 2 puffs into the lungs every 6 (six) hours as needed for wheezing.  1 Inhaler  0  . budesonide (PULMICORT FLEXHALER) 180 MCG/ACT inhaler Inhale 1 puff into the lungs 2 (two) times daily.  1 Inhaler  5  . lisdexamfetamine (VYVANSE) 70 MG capsule Take 1 capsule (70 mg total) by mouth every morning.  90 capsule  0  . calcium carbonate (TUMS - DOSED IN MG ELEMENTAL CALCIUM) 500 MG chewable tablet Chew 1 tablet by mouth daily.      . predniSONE (DELTASONE) 20 MG tablet 2 tabs po qd x 5d  10 tablet  0   No facility-administered medications prior to visit.    PE: Blood pressure 113/76, pulse 65, temperature 98 F (36.7 C), temperature source Oral, resp. rate 16, height 5' 6.5" (1.689 m), weight 175 lb (79.379 kg), SpO2 98.00%. Wt Readings from Last 2 Encounters:  08/24/12 175 lb (79.379 kg)  06/24/12 172 lb (78.019 kg)    Gen: alert, oriented x 4, affect pleasant.  Lucid thinking and conversation noted. HEENT: PERRLA, EOMI.   Neck: no LAD, mass, or thyromegaly. CV: RRR, no m/r/g LUNGS: CTA bilat, nonlabored. NEURO: no tremor or tics noted on observation.  Coordination intact. CN 2-12 grossly intact bilaterally, strength 5/5 in all extremeties.   No ataxia.   IMPRESSION AND PLAN:  ADHD (attention deficit hyperactivity disorder) Problem stable.  Continue current medications and diet appropriate for this condition.  We have reviewed our general long term plan for this problem and also reviewed symptoms and signs that should prompt the patient to call or return to the office. 90 day supply printed, no additional rx's printed. F/u 4 mo.  Mild persistent asthma Much improved/stable. Continue pulmcort flexhaler 180, 1 puff bid.   An After Visit Summary was printed and given to the patient.  FOLLOW UP: 38mo

## 2012-08-24 NOTE — Assessment & Plan Note (Signed)
Problem stable.  Continue current medications and diet appropriate for this condition.  We have reviewed our general long term plan for this problem and also reviewed symptoms and signs that should prompt the patient to call or return to the office. 90 day supply printed, no additional rx's printed. F/u 4 mo.

## 2012-08-24 NOTE — Assessment & Plan Note (Signed)
Much improved/stable. Continue pulmcort flexhaler 180, 1 puff bid.

## 2012-12-29 ENCOUNTER — Other Ambulatory Visit: Payer: Self-pay

## 2013-01-05 ENCOUNTER — Emergency Department (HOSPITAL_BASED_OUTPATIENT_CLINIC_OR_DEPARTMENT_OTHER)
Admission: EM | Admit: 2013-01-05 | Discharge: 2013-01-05 | Disposition: A | Discharge status: Home / Self Care | Payer: No Typology Code available for payment source | Attending: Emergency Medicine | Admitting: Emergency Medicine

## 2013-01-05 ENCOUNTER — Ambulatory Visit (INDEPENDENT_AMBULATORY_CARE_PROVIDER_SITE_OTHER): Payer: No Typology Code available for payment source | Admitting: Family Medicine

## 2013-01-05 ENCOUNTER — Encounter: Payer: Self-pay | Admitting: Family Medicine

## 2013-01-05 ENCOUNTER — Encounter (HOSPITAL_BASED_OUTPATIENT_CLINIC_OR_DEPARTMENT_OTHER): Payer: Self-pay | Admitting: Emergency Medicine

## 2013-01-05 VITALS — BP 123/85 | HR 100 | Temp 98.7°F | Resp 18 | Ht 66.5 in | Wt 166.0 lb

## 2013-01-05 DIAGNOSIS — J45909 Unspecified asthma, uncomplicated: Secondary | ICD-10-CM | POA: Insufficient documentation

## 2013-01-05 DIAGNOSIS — Z8739 Personal history of other diseases of the musculoskeletal system and connective tissue: Secondary | ICD-10-CM | POA: Insufficient documentation

## 2013-01-05 DIAGNOSIS — Z87891 Personal history of nicotine dependence: Secondary | ICD-10-CM | POA: Insufficient documentation

## 2013-01-05 DIAGNOSIS — Z79899 Other long term (current) drug therapy: Secondary | ICD-10-CM | POA: Insufficient documentation

## 2013-01-05 DIAGNOSIS — Z7251 High risk heterosexual behavior: Secondary | ICD-10-CM

## 2013-01-05 DIAGNOSIS — Z711 Person with feared health complaint in whom no diagnosis is made: Secondary | ICD-10-CM

## 2013-01-05 DIAGNOSIS — F909 Attention-deficit hyperactivity disorder, unspecified type: Secondary | ICD-10-CM | POA: Insufficient documentation

## 2013-01-05 DIAGNOSIS — Z202 Contact with and (suspected) exposure to infections with a predominantly sexual mode of transmission: Secondary | ICD-10-CM | POA: Insufficient documentation

## 2013-01-05 LAB — RAPID HIV SCREEN (WH-MAU): Rapid HIV Screen: NONREACTIVE

## 2013-01-05 NOTE — ED Notes (Signed)
Pt requesting HIV test-denies c/o-states he has unprotected sex with a new partner-reports Dr Bella Kennedy sent pt here b/c they do not perform in office

## 2013-01-05 NOTE — ED Provider Notes (Signed)
CSN: [id number]     Arrival date & time 01/05/13  1635 History   First MD Initiated Contact with Patient 01/05/13 1648     Chief Complaint  Patient presents with  . Exposure to STD   (Consider location/radiation/quality/duration/timing/severity/associated sxs/prior Treatment) Patient is a 22 y.o. male presenting with STD exposure. The history is provided by the patient.  Exposure to STD Pertinent negatives include no chest pain, no abdominal pain, no headaches and no shortness of breath.   patient with STD exposure approximately 3 weeks ago. Is concerned about HIV patient has no symptoms patient is completely asymptomatic. Patient also has no pale discharge or genital lesions. Patient just wanted screening for HIV he went to his primary care Dr. for this unable to provide a sore he was sent here to have the screening test done. Patient's past medical history noted.  Past Medical History  Diagnosis Date  . ADHD (attention deficit hyperactivity disorder)   . Marijuana smoker     in the past (quit 2014)  . Mild persistent asthma   . Mid back pain on left side 03/22/2012    Musculoskeletal  . BRBPR (bright red blood per rectum) 06/08/2012    Hemorrhoidal   Past Surgical History  Procedure Laterality Date  . Wisdom tooth extraction     Family History  Problem Relation Age of Onset  . Cancer Father     colon  . Heart disease Father     stent.  +CHF  . Cancer Paternal Grandmother     breast  . Hypertension Paternal Grandfather   . Cancer Paternal Grandfather     colon   History  Substance Use Topics  . Smoking status: Former Smoker    Types: Cigarettes    Start date: 10/25/2010    Quit date: 11/08/2011  . Smokeless tobacco: Never Used     Comment: smoked Black and Mild, no filters  . Alcohol Use: Yes    Review of Systems  Constitutional: Negative for fever.  HENT: Negative for congestion.   Eyes: Negative for redness.  Respiratory: Negative for shortness of breath.    Cardiovascular: Negative for chest pain.  Gastrointestinal: Negative for abdominal pain.  Genitourinary: Negative for dysuria, urgency, scrotal swelling, difficulty urinating, genital sores and testicular pain.  Musculoskeletal: Negative for back pain.  Skin: Negative for rash.  Allergic/Immunologic: Negative for immunocompromised state.  Neurological: Negative for headaches.  Hematological: Does not bruise/bleed easily.  Psychiatric/Behavioral: Negative for confusion.    Allergies  Sulfa antibiotics and Tetracyclines & related  Home Medications   Current Outpatient Rx  Name  Route  Sig  Dispense  Refill  . albuterol (VENTOLIN HFA) 108 (90 BASE) MCG/ACT inhaler   Inhalation   Inhale 2 puffs into the lungs every 6 (six) hours as needed for wheezing.   1 Inhaler   0   . budesonide (PULMICORT FLEXHALER) 180 MCG/ACT inhaler   Inhalation   Inhale 1 puff into the lungs 2 (two) times daily.   1 Inhaler   5   . calcium carbonate (TUMS - DOSED IN MG ELEMENTAL CALCIUM) 500 MG chewable tablet   Oral   Chew 1 tablet by mouth daily.         Marland Kitchen lisdexamfetamine (VYVANSE) 70 MG capsule   Oral   Take 1 capsule (70 mg total) by mouth every morning.   90 capsule   0     July Rx    BP 130/71  Pulse 90  Temp(Src) 98  F (36.7 C) (Oral)  Resp 18  Ht 5\' 8"  (1.727 m)  Wt 165 lb (74.844 kg)  BMI 25.09 kg/m2  SpO2 100% Physical Exam  Constitutional: He is oriented to person, place, and time. He appears well-developed and well-nourished. No distress.  HENT:  Head: Normocephalic and atraumatic.  Mouth/Throat: Oropharynx is clear and moist.  Eyes: Conjunctivae are normal. Pupils are equal, round, and reactive to light.  Neck: Normal range of motion.  Cardiovascular: Normal rate and regular rhythm.   Pulmonary/Chest: Effort normal and breath sounds normal. No respiratory distress.  Abdominal: Soft. Bowel sounds are normal.  Genitourinary: Penis normal.  Musculoskeletal: Normal  range of motion. He exhibits no edema.  Neurological: He is alert and oriented to person, place, and time. No cranial nerve deficit. He exhibits normal muscle tone. Coordination normal.  Skin: Skin is warm. No rash noted.    ED Course  Procedures (including critical care time) Labs Review Labs Reviewed  RAPID HIV SCREEN [facility])   Results for orders placed during the hospital encounter of 01/05/13  RAPID HIV SCREEN ([facility])      Result Value Range   SUDS Rapid HIV Screen NON REACTIVE  NON REACTIVE    Imaging Review No results found.  EKG Interpretation   None       MDM   1. Concern about STD in male without diagnosis    Patient with concern for HIV exposure to someone and screen test gone to his primary care doctor sent him here to have screening test done. Contrast was negative. Exposure was approximately 3 weeks ago. The patient instructed to may want to go ahead and repeat the test in 3-4 weeks if these once more reassurance. Patient has no symptoms no STD symptoms.    Shelda Jakes, MD 01/05/13 [phone]

## 2013-01-05 NOTE — Assessment & Plan Note (Signed)
Will do STD screening: HIV, RPR ([facility]). Urine for GC/Chl collected here and put in [facility] pick-up box. I did some counseling today on STDs.  I told him that if HIV test was negative now he would still need another in 6 mo to be completely sure he was HIV negative. He feels emotionally very bad about what he has done.  I tried to encourage him today. We talked about how he made a mistake but he can definitely learn from it.

## 2013-01-05 NOTE — Progress Notes (Signed)
OFFICE NOTE  01/05/2013  CC: No chief complaint on file.    HPI: Patient is a 22 y.o. Caucasian male who is here for "STD testing". Says he had unprotected sex with a girl who he knows "has been around a lot".  Happened about 3 wks ago when he drank too much when out with some friends. He denies having any STD symptoms. No penile discharge.  No genital ulcers or areas of swelling or tenderness. He is very worried about it b/c he has had sex with his girlfriend since having sex with this other girl.   Pertinent PMH:  Past Medical History  Diagnosis Date  . ADHD (attention deficit hyperactivity disorder)   . Marijuana smoker     in the past (quit 2014)  . Mild persistent asthma   . Mid back pain on left side 03/22/2012    Musculoskeletal  . BRBPR (bright red blood per rectum) 06/08/2012    Hemorrhoidal   Past surgical, social, and family history reviewed and no changes noted since last office visit.  MEDS:  Outpatient Prescriptions Prior to Visit  Medication Sig Dispense Refill  . albuterol (VENTOLIN HFA) 108 (90 BASE) MCG/ACT inhaler Inhale 2 puffs into the lungs every 6 (six) hours as needed for wheezing.  1 Inhaler  0  . budesonide (PULMICORT FLEXHALER) 180 MCG/ACT inhaler Inhale 1 puff into the lungs 2 (two) times daily.  1 Inhaler  5  . calcium carbonate (TUMS - DOSED IN MG ELEMENTAL CALCIUM) 500 MG chewable tablet Chew 1 tablet by mouth daily.      Marland Kitchen lisdexamfetamine (VYVANSE) 70 MG capsule Take 1 capsule (70 mg total) by mouth every morning.  90 capsule  0   No facility-administered medications prior to visit.    PE: Blood pressure 123/85, pulse 100, temperature 98.7 F (37.1 C), temperature source Temporal, resp. rate 18, height 5' 6.5" (1.689 m), weight 166 lb (75.297 kg), SpO2 100.00%. Gen: Alert, well appearing.  Patient is oriented to person, place, time, and situation. No further exam today.  IMPRESSION AND PLAN:  High risk heterosexual behavior Will do STD  screening: HIV, RPR ([facility]). Urine for GC/Chl collected here and put [facility] pick[facility]. I did some counseling today on STDs.  I told him that if HIV test was negative now he would still need another in 6 mo to be completely sure he was HIV negative. He feels emotionally very bad about what he has done.  I tried to encourage him today. We talked about how he made a mistake but he can definitely learn from it.  An After Visit Summary was printed and given to the patient.  FOLLOW UP: prn

## 2013-01-06 ENCOUNTER — Telehealth: Payer: Self-pay | Admitting: Family Medicine

## 2013-01-06 NOTE — Telephone Encounter (Signed)
Please contact patient today with results if possible.

## 2013-01-06 NOTE — Addendum Note (Signed)
Addended by: Eulah Pont on: 01/06/2013 09:48 AM   Modules accepted: Orders

## 2013-01-06 NOTE — Telephone Encounter (Signed)
Please advise 

## 2013-01-06 NOTE — Telephone Encounter (Signed)
Pls notify pt that I have no results in yet.-thx

## 2013-01-09 NOTE — Telephone Encounter (Signed)
Patient aware of results.

## 2013-01-09 NOTE — Telephone Encounter (Signed)
Did you get any results for patient?  Please advise.

## 2013-01-09 NOTE — Telephone Encounter (Signed)
No results. Of note, he did not go to [facility] to get his blood drawn. Apparently he went to the ED the evening he saw me in the office. They did GC/Chl and HIV screen--these were all negative/normal. Reassure him.-thx

## 2013-04-11 ENCOUNTER — Ambulatory Visit: Payer: No Typology Code available for payment source | Admitting: Family Medicine

## 2013-07-06 ENCOUNTER — Encounter: Payer: Self-pay | Admitting: Family Medicine

## 2013-07-06 ENCOUNTER — Ambulatory Visit (INDEPENDENT_AMBULATORY_CARE_PROVIDER_SITE_OTHER): Payer: No Typology Code available for payment source | Admitting: Family Medicine

## 2013-07-06 VITALS — BP 123/81 | HR 69 | Temp 98.4°F | Resp 18 | Ht 68.0 in | Wt 174.0 lb

## 2013-07-06 DIAGNOSIS — K3189 Other diseases of stomach and duodenum: Secondary | ICD-10-CM

## 2013-07-06 DIAGNOSIS — R1013 Epigastric pain: Secondary | ICD-10-CM

## 2013-07-06 DIAGNOSIS — Z7251 High risk heterosexual behavior: Secondary | ICD-10-CM

## 2013-07-06 MED ORDER — RANITIDINE HCL 150 MG PO TABS: ORAL_TABLET | ORAL | Status: DC

## 2013-07-06 MED ORDER — PANTOPRAZOLE SODIUM 40 MG PO TBEC: 40.0000 mg | DELAYED_RELEASE_TABLET | Freq: Every day | ORAL | Status: DC

## 2013-07-06 MED ORDER — PANTOPRAZOLE SODIUM 40 MG PO TBEC: DELAYED_RELEASE_TABLET | ORAL | Status: DC

## 2013-07-06 MED ORDER — RANITIDINE HCL 150 MG PO TABS: 150.0000 mg | ORAL_TABLET | Freq: Two times a day (BID) | ORAL | Status: DC

## 2013-07-06 NOTE — Progress Notes (Signed)
Pre visit review using our clinic review tool, if applicable. No additional management support is needed unless otherwise documented below in the visit note. 

## 2013-07-06 NOTE — Progress Notes (Signed)
OFFICE NOTE  07/06/2013  CC:  Chief Complaint  Patient presents with  . Abdominal Pain     HPI: Patient is a 23 y.o. Caucasian male who is here for GI complaints. Complains of burping constantly, hurts in peri-umbillical region and a bit in epigastric area in midline, sx's for approx 1 yr or more. Only smoking marijuana has helped with sx's and he smokes daily.  The pain was worse in the morning. No cigs but vaps a couple times a week.  Constipation not a problem except when traveling. Recent trial of 15mg  prevacid helped within 1 hour.  No BRBPR or melena.  He is still EXCESSIVELY worring about the possibility of having HIV b/c of one contact with a girl. His initial test was neg and this was 6 mo ago so he is due for repeat today. He has SIGNIFICANT anxiety: chronically worries about his physical sx's, "I'll spend hours on [employer] reading about my symptoms".  Says he does not want help with his anxiety at this time.  Pertinent PMH:  Past Medical History  Diagnosis Date  . ADHD (attention deficit hyperactivity disorder)   . Marijuana smoker     in the past (quit 2014)  . Mild persistent asthma   . Mid back pain on left side 03/22/2012    Musculoskeletal  . BRBPR (bright red blood per rectum) 06/08/2012    Hemorrhoidal   Past Surgical History  Procedure Laterality Date  . Wisdom tooth extraction      MEDS:  NONE  PE: Blood pressure 123/81, pulse 69, temperature 98.4 F (36.9 C), temperature source Temporal, resp. rate 18, height 5\' 8"  (1.727 m), weight 174 lb (78.926 kg), SpO2 99.00%. Gen: Alert, well appearing.  Patient is oriented to person, place, time, and situation. ZOX:WRUEENT:Eyes: no injection, icteris, swelling, or exudate.  EOMI, PERRLA. Mouth: lips without lesion/swelling.  Oral mucosa pink and moist. Oropharynx without erythema, exudate, or swelling.  Neck - No masses or thyromegaly or limitation in range of motion CV: RRR, no m/r/g.   LUNGS: CTA bilat, nonlabored  resps, good aeration in all lung fields. ABD: soft, NT, ND, BS normal.  No hepatospenomegaly or mass.  No bruits. EXT: no clubbing, cyanosis, or edema.   LAB: none today  IMPRESSION AND PLAN:  1) Dyspepsia vs gastritis. Emphasized need for pt to STOP marijuana use. Start pantoprazole 40mg  po qAM and take OTC 150mg  zantac qhs. GERD diet handout reviewed and given to pt. Check CMET and CBC.  2) Hx of high risk heterosexual behavior x 1 in the past: HIV negative 6 mo ago, due for repeat to be 100% sure he is negative.  Patient VERY anxious about this and I suspect this is playing a role in his current physical symptoms. HIV testing done today. Unfortunately, the pt wants no help with his chronic anxiety.  An After Visit Summary was printed and given to the patient.  Spent 25 min with pt today, with >50% of this time spent in counseling and care coordination regarding the above problems.  FOLLOW UP: 1 mo

## 2013-07-07 LAB — CBC WITH DIFFERENTIAL/PLATELET
BASOS ABS: 0 10*3/uL (ref 0.0–0.1)
Basophils Relative: 0.4 % (ref 0.0–3.0)
EOS ABS: 0.2 10*3/uL (ref 0.0–0.7)
Eosinophils Relative: 3 % (ref 0.0–5.0)
HCT: 44.9 % (ref 39.0–52.0)
HEMOGLOBIN: 15.3 g/dL (ref 13.0–17.0)
LYMPHS PCT: 31.2 % (ref 12.0–46.0)
Lymphs Abs: 1.6 10*3/uL (ref 0.7–4.0)
MCHC: 34 g/dL (ref 30.0–36.0)
MCV: 86.3 fl (ref 78.0–100.0)
Monocytes Absolute: 0.2 10*3/uL (ref 0.1–1.0)
Monocytes Relative: 4.1 % (ref 3.0–12.0)
NEUTROS ABS: 3.2 10*3/uL (ref 1.4–7.7)
Neutrophils Relative %: 61.3 % (ref 43.0–77.0)
PLATELETS: 203 10*3/uL (ref 150.0–400.0)
RBC: 5.2 Mil/uL (ref 4.22–5.81)
RDW: 13.4 % (ref 11.5–15.5)
WBC: 5.2 10*3/uL (ref 4.0–10.5)

## 2013-07-07 LAB — COMPREHENSIVE METABOLIC PANEL
ALT: 12 U/L (ref 0–53)
AST: 17 U/L (ref 0–37)
Albumin: 5 g/dL (ref 3.5–5.2)
Alkaline Phosphatase: 62 U/L (ref 39–117)
BILIRUBIN TOTAL: 0.7 mg/dL (ref 0.2–1.2)
BUN: 15 mg/dL (ref 6–23)
CHLORIDE: 104 meq/L (ref 96–112)
CO2: 28 mEq/L (ref 19–32)
CREATININE: 1 mg/dL (ref 0.4–1.5)
Calcium: 10 mg/dL (ref 8.4–10.5)
GFR: 101.02 mL/min (ref >60.00)
Glucose, Bld: 92 mg/dL (ref 70–99)
Potassium: 4.1 mEq/L (ref 3.5–5.1)
SODIUM: 140 meq/L (ref 135–145)
Total Protein: 7.7 g/dL (ref 6.0–8.3)

## 2013-07-07 LAB — HIV ANTIBODY (ROUTINE TESTING W REFLEX): HIV 1&2 Ab, 4th Generation: NONREACTIVE

## 2013-07-12 ENCOUNTER — Encounter: Payer: No Typology Code available for payment source | Admitting: Nutrition

## 2013-07-12 ENCOUNTER — Other Ambulatory Visit: Payer: No Typology Code available for payment source

## 2013-07-12 ENCOUNTER — Telehealth: Payer: Self-pay | Admitting: Genetic Counselor

## 2013-07-12 ENCOUNTER — Encounter: Payer: Self-pay | Admitting: Genetic Counselor

## 2013-07-12 ENCOUNTER — Ambulatory Visit (HOSPITAL_BASED_OUTPATIENT_CLINIC_OR_DEPARTMENT_OTHER): Payer: No Typology Code available for payment source | Admitting: Genetic Counselor

## 2013-07-12 DIAGNOSIS — Z8049 Family history of malignant neoplasm of other genital organs: Secondary | ICD-10-CM

## 2013-07-12 DIAGNOSIS — Z800 Family history of malignant neoplasm of digestive organs: Secondary | ICD-10-CM

## 2013-07-12 DIAGNOSIS — Z803 Family history of malignant neoplasm of breast: Secondary | ICD-10-CM

## 2013-07-12 NOTE — Progress Notes (Signed)
Patient Name: JATERRIUS RICKETSON Patient Age: 23 y.o. Encounter Date: 07/12/2013  Referring Physician: Tammi Sou, MD [address] [place], [place] [address]  Primary Care Provider: Tammi Sou, MD   Mr. Angelia Mould, a 23 y.o. male, is being seen at the [facility] due to a family history of Lynch syndrome.  He presents to clinic today with his father to discuss the likelihood he has Lynch syndrome and discuss genetic testing.  HISTORY OF PRESENT ILLNESS: Mr. Peregoy has no personal history of cancer. His father has an MSH2 mutation identified through [employer]. The mutation is MSH2, [id number] ([id number]).   Past Surgical History  Procedure Laterality Date  . Wisdom tooth extraction      FAMILY HISTORY:   During the visit, a 4-generation pedigree was obtained. Significant diagnoses include the following:  Family History  Problem Relation Age of Onset  . Heart disease Father     stent.  +CHF  . Colon cancer Father 6    Lynch syndrome; MSH2 positive; currently 3  . Breast cancer Paternal Grandmother 41    deceased 53  . Colon cancer Paternal Grandfather 52    deceased 34  . Uterine cancer Paternal Aunt 38    deceased 65  . Colon cancer Paternal Uncle 38    deceased 14    Mr. Mitchum's ancestry is [place]. There is no known Jewish ancestry and no consanguinity.  ASSESSMENT AND PLAN: Mr. Cammack is a 23 y.o. male with a family history of Lynch syndrome in his father and paternal relatives. We reviewed the characteristics, features and inheritance patterns of Lynch syndrome and explained he has a 50% chance of having inherited the MSH2 mutation. We also discussed which other family members need testing, the process of testing, insurance coverage and implications of results.   We reviewed the NCCN guidelines for screenings recommended in those with Lynch syndrome due to a mutation in the MSH2 gene. Colonoscopic screenings are recommended to  begin at age 15-25.  Mr. Casale wished to pursue genetic testing and a blood sample will be sent to [facility] for site-specific analysis for the familial MSH2 mutation. His father also provided a blood sample to be used as a positive control since his sample was not originally analyzed at [facility]. We discussed the implications of a positive and negative result. Results should be available in approximately 4 weeks, at which point we will contact him and address implications for him.    We encouraged Mr. Hilburn to remain in contact with Cancer Genetics annually so that we can update the family history and inform him of any changes in cancer genetics and testing that may be of benefit for this family. Mr.  Majid questions were answered to his satisfaction today.   Thank you for the referral and allowing [place] to share in the care of your patient.   The patient was seen for a total of 30 minutes, greater than 50% of which was spent face-to-face counseling. This patient was discussed with the referring provider who agrees with the above.

## 2013-07-12 NOTE — Telephone Encounter (Signed)
added gen and lab per gen nurse

## 2013-08-10 ENCOUNTER — Encounter: Payer: Self-pay | Admitting: Genetic Counselor

## 2013-08-10 DIAGNOSIS — Z1509 Genetic susceptibility to other malignant neoplasm: Secondary | ICD-10-CM | POA: Insufficient documentation

## 2013-08-10 HISTORY — DX: Genetic susceptibility to other malignant neoplasm: Z15.09

## 2013-08-10 NOTE — Progress Notes (Signed)
Referring Provider: Betsy Coder, MD   PCP:  Ricardo Jericho, MD [address] [place] [address]  HPI:  Mr. Rathke was seen in the [place] [facility] on 07/12/13 due to a family of Lynch syndrome in his father and to discuss genetic testing for the pathogenic MSH2 mutation in the family. Please refer to the prior Genetics clinic note for more information regarding his medical and family histories and our assessment at the time.   GENETIC TESTING:  Mr. Hanneman's father had genetic testing and was found to have an MSH2 pathogenic mutation that causes Lynch syndrome. Mr. Loja's genetic test, which was performed at [facility], revealed that he also has this mutation called p.G162R (also known as c.484G>A). This finding confirms the diagnosis of Lynch syndrome in Mr. Pacer.    CANCER SCREENING:  Because of the increased risk for cancer with Lynch syndrome, we recommend the following, which are from the [employer] (NCCN) and are specific to individuals who have a mutation in the MSH2 gene:   1.  Annual colonoscopy beginning at age 62-25 or 2-5 years prior to the earliest colon cancer diagnosis.   2. While there is no clear evidence to support screening for stomach and small bowel cancer, an upper endoscopy can be considered at 3-5 year intervals beginning at age 81-35. However, whether to have this screening is best determined by the gastroenterologist.   3.  Annual urine cytology beginning at age 37-30.  The information below if for him to share with the women in his family who are found to have this mutation. Unlike the effective surveillance plan outlined above to reduce the risk of colorectal cancer, there is no professional agreement regarding the best way to follow women for the increased risk of uterine and ovarian cancer. Women with Lynch syndrome should understand the following:   1. Women should seek medical attention if they  experience abnormal vaginal bleeding.   2. Although these have not been shown to be effective screening measures, some providers may still recommend periodic uterine biopsies and/or vaginal ultrasounds to monitor the possibility of uterine cancer and/or CA-125 analysis, which is sometimes used to screen for ovarian cancer.  3. A hysterectomy, with removal of the ovaries and fallopian tubes, is a risk-reducing option that women can consider.   FAMILY MEMBERS:  At-risk relatives should have site-specific testing to determine whether or not they have inherited the mutation. We will be happy to meet with any relatives in our clinic or refer them to a genetic counselor in their local area. To locate genetic counselors in other cities, visit the website of the [employer] ([employer]ist[employer].se) and Secretary/administrator for a Social worker by zip code.  Mr. Hegner is aware that each of his siblings and any future children each has a 50% chance of also having this MSH2 mutation.  We encouraged Mr. Huckeby to remain in contact with [place] on an annual basis so we can update his personal and family histories, and let him know of advances in cancer genetics that may benefit the family. Our contact number was provided. Mr. Gangemi questions were answered to his satisfaction today, and he knows he is welcome to call anytime with additional questions.    Steele Berg, MS, [facility] Certified Genetic Counseor phone: [phone] [email or web address]

## 2013-09-28 ENCOUNTER — Ambulatory Visit (INDEPENDENT_AMBULATORY_CARE_PROVIDER_SITE_OTHER): Payer: No Typology Code available for payment source | Admitting: Family Medicine

## 2013-09-28 ENCOUNTER — Encounter: Payer: Self-pay | Admitting: Family Medicine

## 2013-09-28 VITALS — BP 109/72 | HR 58 | Temp 97.8°F | Resp 18 | Ht 68.0 in | Wt 184.0 lb

## 2013-09-28 DIAGNOSIS — K219 Gastro-esophageal reflux disease without esophagitis: Secondary | ICD-10-CM | POA: Insufficient documentation

## 2013-09-28 DIAGNOSIS — F902 Attention-deficit hyperactivity disorder, combined type: Secondary | ICD-10-CM

## 2013-09-28 DIAGNOSIS — S40811A Abrasion of right upper arm, initial encounter: Secondary | ICD-10-CM | POA: Insufficient documentation

## 2013-09-28 DIAGNOSIS — IMO0002 Reserved for concepts with insufficient information to code with codable children: Secondary | ICD-10-CM

## 2013-09-28 DIAGNOSIS — F909 Attention-deficit hyperactivity disorder, unspecified type: Secondary | ICD-10-CM

## 2013-09-28 MED ORDER — LISDEXAMFETAMINE DIMESYLATE 70 MG PO CAPS: 70.0000 mg | ORAL_CAPSULE | ORAL | Status: DC

## 2013-09-28 MED ORDER — SILVER SULFADIAZINE 1 % EX CREA: 1.0000 "application " | TOPICAL_CREAM | Freq: Two times a day (BID) | CUTANEOUS | Status: DC

## 2013-09-28 NOTE — Progress Notes (Signed)
Pre visit review using our clinic review tool, if applicable. No additional management support is needed unless otherwise documented below in the visit note. 
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
## 2014-03-02 ENCOUNTER — Ambulatory Visit: Payer: No Typology Code available for payment source | Admitting: Family Medicine

## 2014-03-02 ENCOUNTER — Ambulatory Visit (INDEPENDENT_AMBULATORY_CARE_PROVIDER_SITE_OTHER): Payer: BLUE CROSS/BLUE SHIELD | Admitting: Family Medicine

## 2014-03-02 DIAGNOSIS — Z23 Encounter for immunization: Secondary | ICD-10-CM

## 2014-06-12 ENCOUNTER — Ambulatory Visit: Payer: BLUE CROSS/BLUE SHIELD | Admitting: Family Medicine

## 2014-06-14 ENCOUNTER — Ambulatory Visit (INDEPENDENT_AMBULATORY_CARE_PROVIDER_SITE_OTHER): Payer: BLUE CROSS/BLUE SHIELD | Admitting: Family Medicine

## 2014-06-14 ENCOUNTER — Encounter: Payer: Self-pay | Admitting: Family Medicine

## 2014-06-14 VITALS — BP 120/80 | HR 69 | Temp 98.8°F | Resp 18 | Ht 68.0 in | Wt 183.0 lb

## 2014-06-14 DIAGNOSIS — F902 Attention-deficit hyperactivity disorder, combined type: Secondary | ICD-10-CM | POA: Diagnosis not present

## 2014-06-14 DIAGNOSIS — S20212A Contusion of left front wall of thorax, initial encounter: Secondary | ICD-10-CM

## 2014-06-14 MED ORDER — LISDEXAMFETAMINE DIMESYLATE 70 MG PO CAPS: 70.0000 mg | ORAL_CAPSULE | ORAL | Status: DC
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
## 2016-01-23 HISTORY — PX: COLONOSCOPY: SHX174

## 2016-01-23 LAB — HM COLONOSCOPY

## 2016-01-31 ENCOUNTER — Encounter: Payer: Self-pay | Admitting: Family Medicine

## 2016-02-02 ENCOUNTER — Encounter: Payer: Self-pay | Admitting: Family Medicine

## 2016-08-19 ENCOUNTER — Ambulatory Visit: Payer: BLUE CROSS/BLUE SHIELD | Admitting: Family Medicine

## 2016-09-15 ENCOUNTER — Ambulatory Visit (INDEPENDENT_AMBULATORY_CARE_PROVIDER_SITE_OTHER): Payer: BLUE CROSS/BLUE SHIELD | Admitting: Family Medicine

## 2016-09-15 ENCOUNTER — Encounter: Payer: Self-pay | Admitting: Family Medicine

## 2016-09-15 VITALS — BP 114/74 | HR 66 | Temp 98.0°F | Resp 16 | Ht 68.0 in | Wt 213.5 lb

## 2016-09-15 DIAGNOSIS — K2100 Gastro-esophageal reflux disease with esophagitis, without bleeding: Secondary | ICD-10-CM

## 2016-09-15 DIAGNOSIS — E669 Obesity, unspecified: Secondary | ICD-10-CM

## 2016-09-15 DIAGNOSIS — F909 Attention-deficit hyperactivity disorder, unspecified type: Secondary | ICD-10-CM

## 2016-09-15 DIAGNOSIS — R1013 Epigastric pain: Secondary | ICD-10-CM

## 2016-09-15 MED ORDER — LISDEXAMFETAMINE DIMESYLATE 20 MG PO CAPS: 20.0000 mg | ORAL_CAPSULE | Freq: Every day | ORAL | 0 refills | Status: DC

## 2016-09-15 MED ORDER — LANSOPRAZOLE 30 MG PO CPDR: 30.0000 mg | DELAYED_RELEASE_CAPSULE | Freq: Every day | ORAL | 6 refills | Status: AC

## 2016-09-15 NOTE — Progress Notes (Signed)
OFFICE VISIT  09/15/2016   CC:  Chief Complaint  Patient presents with  . Follow-up    ADHA  . Gastroesophageal Reflux    HPI:    Patient is a 26 y.o. Caucasian male who presents for stomach complaints/GERD plus f/u ADHD. 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  He quit so he's moving back to this area.  He has not been on vyvanse lately, says the '70mg'$  qd dosing was "way too much".  He asks to be put on much lower dose (20 mg qd).  Having excessive distraction, overwhelmed when he needs to do complex tasks, poor focus and concentration, feels as if he is driven by a motor internally.  No signif anx/dep. No drug use.  Has been having heartburn for a couple months now, burping a lot, some throat discomfort, upper abd discomfort occ. No abd pain, no n/v/d.  Drinking a lot of soda.  Zantac not very helpful but PPI qd (his dad's prevacid) is very helpful.  Taking tums frequently as well. He has gained 30 lbs over the last 2 yrs. He quit smoking marijuana 2 mo ago.    We discussed his diet today: he claims he doesn't overeat or eat a lot of fatty foods but says he drinks EXCESSIVE cola/high fructose drinks.  He does not exercise--he's pretty sedentary, in fact.  He is aware of his wt gain over the last 2 yrs, esp the last few months, is getting more motivated to start some TLC.   Past Medical History:  Diagnosis Date  . ADHD (attention deficit hyperactivity disorder)   . BRBPR (bright red blood per rectum) 06/08/2012   Hemorrhoidal  . Family history of Lynch syndrome    Father w/ Lynch syndrome; MSH2 mutation  . 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 Mid back pain on left side 03/22/2012   Musculoskeletal  . Mild persistent asthma     Past Surgical History:  Procedure Laterality Date  . COLONOSCOPY  01/23/2016   repeat in 2 years  . WISDOM TOOTH EXTRACTION      Outpatient  Medications Prior to Visit  Medication Sig Dispense Refill  . albuterol (VENTOLIN HFA) 108 (90 BASE) MCG/ACT inhaler Inhale 2 puffs into the lungs every 6 (six) hours as needed for wheezing. (Patient not taking: Reported on 06/14/2014) 1 Inhaler 0  . budesonide (PULMICORT FLEXHALER) 180 MCG/ACT inhaler Inhale 1 puff into the lungs 2 (two) times daily. (Patient not taking: Reported on 09/15/2016) 1 Inhaler 5  . lisdexamfetamine (VYVANSE) 70 MG capsule Take 1 capsule (70 mg total) by mouth every morning. (Patient not taking: Reported on 09/15/2016) 90 capsule 0  . silver sulfADIAZINE (SILVADENE) 1 % cream Apply 1 application topically 2 (two) times daily. (Patient not taking: Reported on 09/15/2016) 50 g 0   No facility-administered medications prior to visit.     Allergies  Allergen Reactions  . Sulfa Antibiotics Anaphylaxis  . Tetracyclines & Related Other (See Comments)    Teeth staining    ROS As per HPI  PE: Blood pressure 114/74, pulse 66, temperature 98 F (36.7 C), temperature source Oral, resp. rate 16, height '5\' 8"'$  (1.727 m), weight 213 lb 8 oz (96.8 kg), SpO2 98 %. Body mass index is 32.46 kg/m.  Wt Readings from Last 2 Encounters:  09/15/16 213 lb 8 oz (96.8 kg)  06/14/14 183 lb (83 kg)    Gen:  alert, oriented x 4, affect pleasant.  Lucid thinking and conversation noted. HEENT: PERRLA, EOMI.   Neck: no LAD, mass, or thyromegaly. CV: RRR, no m/r/g.  No chest wall tenderness. ABd: soft, NT/ND LUNGS: CTA bilat, nonlabored. NEURO: no tremor or tics noted on observation.  Coordination intact. CN 2-12 grossly intact bilaterally, strength 5/5 in all extremeties.  No ataxia.   LABS:  No results found for: TSH Lab Results  Component Value Date   WBC 5.2 07/06/2013   HGB 15.3 07/06/2013   HCT 44.9 07/06/2013   MCV 86.3 07/06/2013   PLT 203.0 07/06/2013   Lab Results  Component Value Date   CREATININE 1.0 07/06/2013   BUN 15 07/06/2013   NA 140 07/06/2013   K 4.1  07/06/2013   CL 104 07/06/2013   CO2 28 07/06/2013   Lab Results  Component Value Date   ALT 12 07/06/2013   AST 17 07/06/2013   ALKPHOS 62 07/06/2013   BILITOT 0.7 07/06/2013   Lab Results  Component Value Date   CHOL 171 06/08/2012   Lab Results  Component Value Date   HDL 47.60 06/08/2012   Lab Results  Component Value Date   LDLCALC 108 (H) 06/08/2012   Lab Results  Component Value Date   TRIG 79.0 06/08/2012   Lab Results  Component Value Date   CHOLHDL 4 06/08/2012   IMPRESSION AND PLAN:  1) GERD, with some mild dyspepsia and esophagitis symptoms. Start prevacid '30mg'$  qd, continue zantac 150 otc qhs. GERD diet reviewed and given to pt today.  2) Adult ADHD: not medicated lately.  Vyvanse at the 70 mg qd dosing was causing excessive focus, insomnia. We decided to drop down to '20mg'$  qd dosing and see how he does over the next month. Therapeutic expectations and side effect profile of medication discussed today.  Patient's questions answered.  3) Obesity, class 1: discussed TLC--particularly cutting hot high fructose corn syrup drinks and starting exercise.  An After Visit Summary was printed and given to the patient.  FOLLOW UP: Return in about 4 weeks (around 10/13/2016) for f/u ADD/GERD.  Signed:  Crissie Sickles, MD           09/15/2016

## 2016-10-02 ENCOUNTER — Telehealth: Payer: Self-pay | Admitting: *Deleted

## 2016-10-02 NOTE — Telephone Encounter (Signed)
Pt called wanting to know if he could double up on his vyvanse. He stated that the 20mg  is not helping. I advised pt that Dr. Milinda CaveMcGowen is out of the office today and may not response back to this message until Monday. Pt wanted me to advise him if it would be okay to double up on his medication. I advised him that I am unable to give medical advise with out Dr. Verdis FredericksonMcGowens approval. Pt voiced understanding but seemed disappointed. Please advise. Thanks.

## 2016-10-04 MED ORDER — LISDEXAMFETAMINE DIMESYLATE 40 MG PO CAPS: 40.0000 mg | ORAL_CAPSULE | ORAL | 0 refills | Status: AC

## 2016-10-04 NOTE — Telephone Encounter (Signed)
OK to double up on the 20mg  caps. I printed a rx for 40mg  caps for him to pick up and fill when he is out of his 20mg  caps. Keep f/u for about 1 mo from his most recent o/v as planned.

## 2016-10-05 NOTE — Telephone Encounter (Signed)
Pt advised and voiced understanding. Rx put up front for p/u. F/u apt made for 10/16/16 at 11:30am.

## 2016-10-16 ENCOUNTER — Ambulatory Visit: Payer: BLUE CROSS/BLUE SHIELD | Admitting: Family Medicine

## 2016-10-16 NOTE — Progress Notes (Deleted)
OFFICE VISIT  10/16/2016   CC: No chief complaint on file.    HPI:    Patient is a 26 y.o. Caucasian male who presents for f/u ADHD. Last visit I started him on vyvanse 20 mg qd. He called a couple weeks later requesting the dose be increased to '40mg'$  qd b/c he said the '20mg'$  dose was not helpful. I authorized this increase to '40mg'$  qd.  Past Medical History:  Diagnosis Date  . ADHD (attention deficit hyperactivity disorder)   . BRBPR (bright red blood per rectum) 06/08/2012   Hemorrhoidal  . Family history of Lynch syndrome    Father w/ Lynch syndrome; MSH2 mutation  . 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 Mid back pain on left side 03/22/2012   Musculoskeletal  . Mild persistent asthma     Past Surgical History:  Procedure Laterality Date  . COLONOSCOPY  01/23/2016   repeat in 2 years  . WISDOM TOOTH EXTRACTION      Outpatient Medications Prior to Visit  Medication Sig Dispense Refill  . lansoprazole (PREVACID) 30 MG capsule Take 1 capsule (30 mg total) by mouth daily at 12 noon. 30 capsule 6  . lisdexamfetamine (VYVANSE) 40 MG capsule Take 1 capsule (40 mg total) by mouth every morning. 30 capsule 0  . ranitidine (ZANTAC) 150 MG capsule Take 150 mg by mouth 2 (two) times daily.     No facility-administered medications prior to visit.     Allergies  Allergen Reactions  . Sulfa Antibiotics Anaphylaxis  . Tetracyclines & Related Other (See Comments)    Teeth staining    ROS As per HPI  PE: There were no vitals taken for this visit. ***  LABS:  ***  IMPRESSION AND PLAN:  No problem-specific Assessment & Plan notes found for this encounter.   FOLLOW UP: No Follow-up on file.

## 2017-02-15 ENCOUNTER — Ambulatory Visit: Payer: BLUE CROSS/BLUE SHIELD | Admitting: Family Medicine

## 2017-02-15 ENCOUNTER — Encounter: Payer: Self-pay | Admitting: Family Medicine

## 2017-02-15 VITALS — BP 136/96 | HR 65 | Temp 98.2°F | Wt 231.0 lb

## 2017-02-15 DIAGNOSIS — K219 Gastro-esophageal reflux disease without esophagitis: Secondary | ICD-10-CM

## 2017-02-15 NOTE — Progress Notes (Signed)
Jacob Alexander , 03-14-1990, 26 y.o., male MRN: [id number] Patient Care Team    Relationship Specialty Notifications Start End  McGowen, Adrian Blackwater, MD PCP - General Family Medicine  12/09/11   Ronald Lobo, MD Consulting Physician Gastroenterology  02/02/16     Chief Complaint  Patient presents with  . Gastroesophageal Reflux    pts complain of reflux that started 2 days ago. pts did not used any meds     Subjective: Pt presents for an OV with complaints of vomit x1 this morning. Patients reports he woke up with a mild sore throat and then vomited x1. He is not able to described vomit. He reports he got hot when he vomited but that resolved. He had a ore throat 2 days ago, but that also resolved until he vomited. He has a h/o GERD and has been without his medication for a few days. He denies nausea, diarrhea, abd pain, fever or chills. He denies any URI or GI like symptoms.    Depression screen PHQ 2/9 03/11/17  Down, Depressed, Hopeless 0  PHQ - 2 Score 0    Allergies  Allergen Reactions  . Sulfa Antibiotics Anaphylaxis  . Tetracyclines & Related Other (See Comments)    Teeth staining   Social History   Tobacco Use  . Smoking status: Former Smoker    Types: Cigarettes    Start date: 10/25/2010    Last attempt to quit: 11/08/2011    Years since quitting: 5.2  . Smokeless tobacco: Never Used  . Tobacco comment: smoked Black and Mild, no filters  Substance Use Topics  . Alcohol use: Yes   Past Medical History:  Diagnosis Date  . ADHD (attention deficit hyperactivity disorder)   . BRBPR (bright red blood per rectum) 06/08/2012   Hemorrhoidal  . Family history of Lynch syndrome    Father w/ Lynch syndrome; MSH2 mutation  . 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 Mid back pain on left side 03/22/2012   Musculoskeletal  . Mild persistent asthma    Past Surgical History:    Procedure Laterality Date  . COLONOSCOPY  01/23/2016   repeat in 2 years  . WISDOM TOOTH EXTRACTION     Family History  Problem Relation Age of Onset  . Heart disease Father        stent.  +CHF  . Colon cancer Father 59       Lynch syndrome; MSH2 positive; currently 30  . Breast cancer Paternal Grandmother 11       deceased 31  . Colon cancer Paternal Grandfather 7       deceased 34  . Uterine cancer Paternal Aunt 7       deceased 53  . Colon cancer Paternal Uncle 9       deceased 67   Allergies as of 03/11/17      Reactions   Sulfa Antibiotics Anaphylaxis   Tetracyclines & Related Other (See Comments)   Teeth staining      Medication List        Accurate as of 03/11/17 11:35 AM. Always use your most recent med list.          lansoprazole 30 MG capsule Commonly known as:  PREVACID Take 1 capsule (30 mg total) by mouth daily at 12 noon.   lisdexamfetamine 40 MG capsule Commonly known as:  VYVANSE Take 1 capsule (40 mg total)  by mouth every morning.   ranitidine 150 MG capsule Commonly known as:  ZANTAC Take 150 mg by mouth 2 (two) times daily.       All past medical history, surgical history, allergies, family history, immunizations andmedications were updated in the EMR today and reviewed under the history and medication portions of their EMR.     ROS: Negative, with the exception of above mentioned in HPI   Objective:  BP (!) 136/96 (BP Location: Left Arm, Patient Position: Sitting, Cuff Size: Large)   Pulse 65   Temp 98.2 F (36.8 C) (Oral)   Wt 231 lb (104.8 kg)   SpO2 95%   BMI 35.12 kg/m  Body mass index is 35.12 kg/m. Gen: Afebrile. No acute distress. Nontoxic in appearance, well developed, well nourished.  HENT: AT. [place]. Bilateral TM visualized with erythema. MMM, no oral lesions. Bilateral nares without erythema, swelling or drainage. Throat with very mild erythema, no exudates, no cough, no hoarseness. No sinus pressure.  Eyes:Pupils  Equal Round Reactive to light, Extraocular movements intact,  Conjunctiva without redness, discharge or icterus. Neck/lymp/endocrine: Supple,no lymphadenopathy CV: RRR  Chest: CTAB, no wheeze or crackles. Good air movement, normal resp effort.  Abd: Soft. NTND. BS present.   No exam data present No results found. No results found for this or any previous visit (from the past 24 hour(s)).  Assessment/Plan: BO TEICHER is a 26 y.o. male present for OV for  Gastroesophageal reflux disease without esophagitis - discussed options with pt today. This sounds like GERD. No signs of acute infection on exam today. He has been without medications for a few days.  - restart medication daily during holidays. - Caution on laying flat after meals/overeating over the holidays. Etc - GERD AVS provided.  - F/U PRN   Reviewed expectations re: course of current medical issues.  Discussed self-management of symptoms.  Outlined signs and symptoms indicating need for more acute intervention.  Patient verbalized understanding and all questions were answered.  Patient received an After-Visit Summary.    No orders of the defined types were placed in this encounter.    Note is dictated utilizing voice recognition software. Although note has been proof read prior to signing, occasional typographical errors still can be missed. If any questions arise, please do not hesitate to call for verification.   electronically signed by:  Howard Pouch, DO  [place]

## 2017-02-15 NOTE — Patient Instructions (Signed)
This sounds like GERD. Take the medication daily over the holiday season. If worsening please see your PCP.    Gastroesophageal Reflux Disease, Adult Normally, food travels down the esophagus and stays in the stomach to be digested. If a person has gastroesophageal reflux disease (GERD), food and stomach acid move back up into the esophagus. When this happens, the esophagus becomes sore and swollen (inflamed). Over time, GERD can make small holes (ulcers) in the lining of the esophagus. Follow these instructions at home: Diet  Follow a diet as told by your doctor. You may need to avoid foods and drinks such as: ? Coffee and tea (with or without caffeine). ? Drinks that contain alcohol. ? Energy drinks and sports drinks. ? Carbonated drinks or sodas. ? Chocolate and cocoa. ? Peppermint and mint flavorings. ? Garlic and onions. ? Horseradish. ? Spicy and acidic foods, such as peppers, chili powder, curry powder, vinegar, hot sauces, and BBQ sauce. ? Citrus fruit juices and citrus fruits, such as oranges, lemons, and limes. ? Tomato-based foods, such as red sauce, chili, salsa, and pizza with red sauce. ? Fried and fatty foods, such as donuts, french fries, potato chips, and high-fat dressings. ? High-fat meats, such as hot dogs, rib eye steak, sausage, ham, and bacon. ? High-fat dairy items, such as whole milk, butter, and cream cheese.  Eat small meals often. Avoid eating large meals.  Avoid drinking large amounts of liquid with your meals.  Avoid eating meals during the 2-3 hours before bedtime.  Avoid lying down right after you eat.  Do not exercise right after you eat. General instructions  Pay attention to any changes in your symptoms.  Take over-the-counter and prescription medicines only as told by your doctor. Do not take aspirin, ibuprofen, or other NSAIDs unless your doctor says it is okay.  Do not use any tobacco products, including cigarettes, chewing tobacco, and  e-cigarettes. If you need help quitting, ask your doctor.  Wear loose clothes. Do not wear anything tight around your waist.  Raise (elevate) the head of your bed about 6 inches (15 cm).  Try to lower your stress. If you need help doing this, ask your doctor.  If you are overweight, lose an amount of weight that is healthy for you. Ask your doctor about a safe weight loss goal.  Keep all follow-up visits as told by your doctor. This is important. Contact a doctor if:  You have new symptoms.  You lose weight and you do not know why it is happening.  You have trouble swallowing, or it hurts to swallow.  You have wheezing or a cough that keeps happening.  Your symptoms do not get better with treatment.  You have a hoarse voice. Get help right away if:  You have pain in your arms, neck, jaw, teeth, or back.  You feel sweaty, dizzy, or light-headed.  You have chest pain or shortness of breath.  You throw up (vomit) and your throw up looks like blood or coffee grounds.  You pass out (faint).  Your poop (stool) is bloody or black.  You cannot swallow, drink, or eat. This information is not intended to replace advice given to you by your health care provider. Make sure you discuss any questions you have with your health care provider. Document Released: 07/29/2007 Document Revised: 07/18/2015 Document Reviewed: 06/06/2014 Elsevier Interactive Patient Education  [employer]2018 [employer]

## 2017-04-13 ENCOUNTER — Encounter: Payer: Self-pay | Admitting: Family Medicine

## 2018-01-12 ENCOUNTER — Encounter: Payer: Self-pay | Admitting: *Deleted
